# Patient Record
Sex: Female | Born: 1970 | Race: White | Hispanic: No | Marital: Single | Smoking: Former smoker
Health system: Southern US, Community
[De-identification: ages and names within clinical notes are randomized; demographics above are authoritative.]

## PROBLEM LIST (undated history)

## (undated) DIAGNOSIS — B019 Varicella without complication: Secondary | ICD-10-CM

## (undated) DIAGNOSIS — E079 Disorder of thyroid, unspecified: Secondary | ICD-10-CM

## (undated) DIAGNOSIS — J45909 Unspecified asthma, uncomplicated: Secondary | ICD-10-CM

## (undated) DIAGNOSIS — N200 Calculus of kidney: Secondary | ICD-10-CM

## (undated) DIAGNOSIS — N390 Urinary tract infection, site not specified: Secondary | ICD-10-CM

## (undated) DIAGNOSIS — G43909 Migraine, unspecified, not intractable, without status migrainosus: Secondary | ICD-10-CM

## (undated) DIAGNOSIS — I10 Essential (primary) hypertension: Secondary | ICD-10-CM

## (undated) DIAGNOSIS — K219 Gastro-esophageal reflux disease without esophagitis: Secondary | ICD-10-CM

## (undated) DIAGNOSIS — T7840XA Allergy, unspecified, initial encounter: Secondary | ICD-10-CM

## (undated) DIAGNOSIS — B029 Zoster without complications: Secondary | ICD-10-CM

## (undated) HISTORY — DX: Varicella without complication: B01.9

## (undated) HISTORY — DX: Unspecified asthma, uncomplicated: J45.909

## (undated) HISTORY — DX: Essential (primary) hypertension: I10

## (undated) HISTORY — PX: LITHOTRIPSY: SUR834

## (undated) HISTORY — PX: REDUCTION MAMMAPLASTY: SUR839

## (undated) HISTORY — DX: Urinary tract infection, site not specified: N39.0

## (undated) HISTORY — DX: Disorder of thyroid, unspecified: E07.9

## (undated) HISTORY — PX: ABDOMINAL HYSTERECTOMY: SHX81

## (undated) HISTORY — DX: Zoster without complications: B02.9

## (undated) HISTORY — DX: Migraine, unspecified, not intractable, without status migrainosus: G43.909

## (undated) HISTORY — PX: ANKLE RECONSTRUCTION: SHX1151

## (undated) HISTORY — DX: Gastro-esophageal reflux disease without esophagitis: K21.9

## (undated) HISTORY — DX: Allergy, unspecified, initial encounter: T78.40XA

## (undated) HISTORY — DX: Calculus of kidney: N20.0

---

## 1998-07-18 HISTORY — PX: BREAST SURGERY: SHX581

## 2014-06-16 ENCOUNTER — Ambulatory Visit (INDEPENDENT_AMBULATORY_CARE_PROVIDER_SITE_OTHER): Payer: Managed Care, Other (non HMO) | Admitting: Family

## 2014-06-16 ENCOUNTER — Encounter: Payer: Self-pay | Admitting: Family

## 2014-06-16 VITALS — BP 142/88 | HR 79 | Temp 98.7°F | Resp 18 | Ht 66.5 in | Wt 186.4 lb

## 2014-06-16 DIAGNOSIS — J45909 Unspecified asthma, uncomplicated: Secondary | ICD-10-CM | POA: Insufficient documentation

## 2014-06-16 DIAGNOSIS — K219 Gastro-esophageal reflux disease without esophagitis: Secondary | ICD-10-CM

## 2014-06-16 DIAGNOSIS — J4520 Mild intermittent asthma, uncomplicated: Secondary | ICD-10-CM

## 2014-06-16 DIAGNOSIS — Z124 Encounter for screening for malignant neoplasm of cervix: Secondary | ICD-10-CM

## 2014-06-16 NOTE — Progress Notes (Signed)
   Subjective:    Patient ID: Karen Hudson, female    DOB: 22-May-1971, 43 y.o.   MRN: [id number]  Chief Complaint  Patient presents with  . Establish Care    No issues just needs a PCP    HPI:  Karen Hudson is a 43 y.o. female who presents today to establish care and discuss her heartburn and asthma.   1) Heartburn - Currently stable, maintained on OTC medications as needed.  2) Asthma - Diagnosed when she was a child. Used to carry an inhaler, but has not used it in a while. Does not currently take any medications. Has not had an asthma attack in the last 10 years.   Allergies  Allergen Reactions  . Sulfa Antibiotics    No current outpatient prescriptions on file prior to visit.   No current facility-administered medications on file prior to visit.   Past Medical History  Diagnosis Date  . Asthma   . Allergy   . Chicken pox   . Shingles   . GERD (gastroesophageal reflux disease)   . Kidney stones   . Thyroid disease     Enlarged thyroid  . Urinary tract infection    Review of Systems    See HPI  Objective:    BP 142/88 mmHg  Pulse 79  Temp(Src) 98.7 F (37.1 C) (Oral)  Resp 18  Ht 5' 6.5" (1.689 m)  Wt 186 lb 6.4 oz (84.55 kg)  BMI 29.64 kg/m2  SpO2 97%  LMP 05/23/2014 Nursing note and vital signs reviewed.  Physical Exam  Constitutional: She is oriented to person, place, and time. She appears well-developed and well-nourished. No distress.  Cardiovascular: Normal rate, regular rhythm, normal heart sounds and intact distal pulses.   Pulmonary/Chest: Effort normal and breath sounds normal.  Neurological: She is alert and oriented to person, place, and time.  Skin: Skin is warm and dry.  Psychiatric: She has a normal mood and affect. Her behavior is normal. Judgment and thought content normal.       Assessment & Plan:

## 2014-06-16 NOTE — Progress Notes (Signed)
Pre visit review using our clinic review tool, if applicable. No additional management support is needed unless otherwise documented below in the visit note. 

## 2014-06-16 NOTE — Patient Instructions (Signed)
Thank you for choosing [facility].  Summary/Instructions:  Schedule a time for your physical.  A referral has been made to gynecology for your cervical cancer screening.

## 2014-06-16 NOTE — Assessment & Plan Note (Signed)
Stable. Continue without medications as needed. Should symptoms return, will restart albuterol.

## 2014-06-16 NOTE — Assessment & Plan Note (Signed)
Stable. Continue over the counter medications as needed. Consider prescription medication if symptoms worsen.

## 2014-06-27 ENCOUNTER — Telehealth: Payer: Self-pay | Admitting: Nurse Practitioner

## 2014-06-27 NOTE — Telephone Encounter (Signed)
lmtcb to schedule a new patient doctor referral for an AEX.

## 2014-07-01 NOTE — Telephone Encounter (Signed)
lmtcb to rs

## 2014-08-29 ENCOUNTER — Encounter: Payer: Self-pay | Admitting: Certified Nurse Midwife

## 2014-08-29 ENCOUNTER — Ambulatory Visit (INDEPENDENT_AMBULATORY_CARE_PROVIDER_SITE_OTHER): Payer: Managed Care, Other (non HMO) | Admitting: Certified Nurse Midwife

## 2014-08-29 VITALS — BP 112/74 | HR 68 | Resp 16 | Ht 66.25 in | Wt 185.0 lb

## 2014-08-29 DIAGNOSIS — Z01419 Encounter for gynecological examination (general) (routine) without abnormal findings: Secondary | ICD-10-CM

## 2014-08-29 DIAGNOSIS — Z23 Encounter for immunization: Secondary | ICD-10-CM

## 2014-08-29 DIAGNOSIS — B372 Candidiasis of skin and nail: Secondary | ICD-10-CM

## 2014-08-29 DIAGNOSIS — N852 Hypertrophy of uterus: Secondary | ICD-10-CM

## 2014-08-29 MED ORDER — NYSTATIN 100000 UNIT/GM EX POWD: Freq: Two times a day (BID) | CUTANEOUS | Status: DC

## 2014-08-29 NOTE — Patient Instructions (Signed)

## 2014-08-29 NOTE — Progress Notes (Signed)
Reviewed personally.  M. Suzanne Marston Mccadden, MD.  

## 2014-08-29 NOTE — Progress Notes (Signed)
44 y.o. [id number] Single  Caucasian Fe here to establish gyn care and  for annual exam. Periods normal, no issues. Contraception condoms No STD concerns or testing. Patient on weight watchers and has lost 40 pounds so far. Continues with program. History of enlarged thyroid with PCP management. Sees PCP aex, labs.. Patient has history of kidney stones with lithotripsy.  No health issues today, but requests look at area in groin ? Yeast.   Patient's last menstrual period was 07/31/2014.          Sexually active: Yes.    The current method of family planning is condoms most of the time.    Exercising: Yes.    walking Smoker:  no  Health Maintenance: Pap:  2012 neg per patient No abnormal paps per patient MMG: 2011? Colonoscopy:  none BMD:   none TDaP: due Labs: none Self breast exam: done occ   reports that she has quit smoking. She has never used smokeless tobacco. She reports that she drinks alcohol. She reports that she does not use illicit drugs.  Past Medical History  Diagnosis Date  . Asthma   . Allergy   . Chicken pox   . Shingles   . GERD (gastroesophageal reflux disease)   . Kidney stones   . Thyroid disease     Enlarged thyroid  . Urinary tract infection   . Migraines   . Asthma     as a child  . Hypertension     in the past, no longer on meds    Past Surgical History  Procedure Laterality Date  . Breast surgery  2000    breast reduction  . Ankle reconstruction    . Lithotripsy      No current outpatient prescriptions on file.   No current facility-administered medications for this visit.    Family History  Problem Relation Age of Onset  . Heart disease Father   . Squamous cell carcinoma Father   . Prostate cancer Maternal Grandfather   . Stroke Paternal Grandfather   . Hypertension Sister   . Hypertension Sister     ROS:  Pertinent items are noted in HPI.  Otherwise, a comprehensive ROS was negative.  Exam:   BP 112/74 mmHg  Pulse 68  Resp 16   Ht 5' 6.25" (1.683 m)  Wt 185 lb (83.915 kg)  BMI 29.63 kg/m2  LMP 07/31/2014 Height: 5' 6.25" (168.3 cm) Ht Readings from Last 3 Encounters:  08/29/14 5' 6.25" (1.683 m)  06/16/14 5' 6.5" (1.689 m)    General appearance: alert, cooperative and appears stated age Head: Normocephalic, without obvious abnormality, atraumatic Neck: no adenopathy, supple, symmetrical, trachea midline and thyroid enlarged Lungs: clear to auscultation bilaterally Breasts: normal appearance, no masses or tenderness, No nipple retraction or dimpling, No nipple discharge or bleeding, No axillary or supraclavicular adenopathy Heart: regular rate and rhythm Abdomen: soft, non-tender; no masses,  no organomegaly Extremities: extremities normal, atraumatic, no cyanosis or edema Skin: Skin color, texture, turgor normal. No rashes or lesions Lymph nodes: Cervical, supraclavicular, and axillary nodes normal. No abnormal inguinal nodes palpated Neurologic: Grossly normal   Pelvic: External genitalia:  no lesions, macular scaling area in groin with exudate, wet prep taken              Urethra:  normal appearing urethra with no masses, tenderness or lesions              Bartholin's and Skene's: normal  Vagina: normal appearing vagina with normal color and discharge, no lesions              Cervix: normal, non tender, no lesion              Pap taken: Yes.   Bimanual Exam:  Uterus:  enlarged, 14-16 week size, slightly firm tilts to left weeks size              Adnexa: normal adnexa and no mass, fullness, tenderness  Difficult to feel left adnexal as well, due to enlarged uterus               Rectovaginal: Confirms               Anus:  normal sphincter tone, no lesions  Wet prep: positive for yeast  Chaperone present: Yes  A:  Well Woman with normal exam  Contraception condoms  Enlarged uterus  Immunization update  Yeast dermatitis of groin area  Screening labs  P:   Reviewed health and wellness  pertinent to exam  Discussed enlarged uterus finding and possible etiology of uterine fibroids, adenomyosis, or mass. Discussed need PUS to evaluate, patient agreeable. Discussed with patient she will be called with insurance information and will be scheduled. Questions addressed.  Requests TDAP  Discussed yeast finding and need to dry area thoroughly before dressing to avoid reoccurrence.  Rx Nystatin powder see order  Pap smear taken today with HPVHR pap sent to [facility]  Rx given for screening labs CMP,Lipid panel, CBC, Vitamin D  counseled on breast self exam, mammography screening , given information on where to schedule, STD prevention, HIV risk factors and prevention, adequate intake of calcium and vitamin D, diet and exercise  return annually or prn  An After Visit Summary was printed and given to the patient.

## 2014-09-02 ENCOUNTER — Other Ambulatory Visit: Payer: Self-pay | Admitting: Certified Nurse Midwife

## 2014-09-02 NOTE — Addendum Note (Signed)
Addended by: Michele Mcalpine on: 09/02/2014 10:22 AM   Modules accepted: Orders

## 2014-09-03 LAB — CBC WITH DIFFERENTIAL/PLATELET
BASOS ABS: 0 10*3/uL (ref 0.0–0.2)
Basos: 1 %
EOS: 2 %
Eosinophils Absolute: 0.2 10*3/uL (ref 0.0–0.4)
HCT: 44 % (ref 34.0–46.6)
Hemoglobin: 14.6 g/dL (ref 11.1–15.9)
IMMATURE GRANS (ABS): 0 10*3/uL (ref 0.0–0.1)
Immature Granulocytes: 0 %
LYMPHS: 28 %
Lymphocytes Absolute: 1.8 10*3/uL (ref 0.7–3.1)
MCH: 29.3 pg (ref 26.6–33.0)
MCHC: 33.2 g/dL (ref 31.5–35.7)
MCV: 88 fL (ref 79–97)
MONOCYTES: 8 %
Monocytes Absolute: 0.5 10*3/uL (ref 0.1–0.9)
NEUTROS PCT: 61 %
Neutrophils Absolute: 4.1 10*3/uL (ref 1.4–7.0)
PLATELETS: 272 10*3/uL (ref 150–379)
RBC: 4.98 x10E6/uL (ref 3.77–5.28)
RDW: 13.9 % (ref 12.3–15.4)
WBC: 6.6 10*3/uL (ref 3.4–10.8)

## 2014-09-03 LAB — LIPID PANEL W/O CHOL/HDL RATIO
Cholesterol, Total: 196 mg/dL (ref 100–199)
HDL: 52 mg/dL (ref >39)
LDL Calculated: 131 mg/dL — ABNORMAL HIGH (ref 0–99)
Triglycerides: 64 mg/dL (ref 0–149)
VLDL Cholesterol Cal: 13 mg/dL (ref 5–40)

## 2014-09-03 LAB — COMPREHENSIVE METABOLIC PANEL
ALK PHOS: 48 IU/L (ref 39–117)
ALT: 6 IU/L (ref 0–32)
AST: 12 IU/L (ref 0–40)
Albumin/Globulin Ratio: 1.8 (ref 1.1–2.5)
Albumin: 4.2 g/dL (ref 3.5–5.5)
BILIRUBIN TOTAL: 0.4 mg/dL (ref 0.0–1.2)
BUN / CREAT RATIO: 18 (ref 9–23)
BUN: 13 mg/dL (ref 6–24)
CO2: 24 mmol/L (ref 18–29)
Calcium: 9.4 mg/dL (ref 8.7–10.2)
Chloride: 102 mmol/L (ref 97–108)
Creatinine, Ser: 0.71 mg/dL (ref 0.57–1.00)
GFR calc Af Amer: 119 mL/min/{1.73_m2} (ref >59)
GFR, EST NON AFRICAN AMERICAN: 103 mL/min/{1.73_m2} (ref >59)
Globulin, Total: 2.4 g/dL (ref 1.5–4.5)
Glucose: 87 mg/dL (ref 65–99)
Potassium: 4.6 mmol/L (ref 3.5–5.2)
SODIUM: 141 mmol/L (ref 134–144)
TOTAL PROTEIN: 6.6 g/dL (ref 6.0–8.5)

## 2014-09-03 LAB — VITAMIN D 25 HYDROXY (VIT D DEFICIENCY, FRACTURES): Vit D, 25-Hydroxy: 27.8 ng/mL — ABNORMAL LOW (ref 30.0–100.0)

## 2014-09-23 ENCOUNTER — Ambulatory Visit (INDEPENDENT_AMBULATORY_CARE_PROVIDER_SITE_OTHER): Payer: Managed Care, Other (non HMO) | Admitting: Obstetrics & Gynecology

## 2014-09-23 ENCOUNTER — Other Ambulatory Visit: Payer: Self-pay | Admitting: Obstetrics & Gynecology

## 2014-09-23 ENCOUNTER — Ambulatory Visit (INDEPENDENT_AMBULATORY_CARE_PROVIDER_SITE_OTHER): Payer: Managed Care, Other (non HMO)

## 2014-09-23 VITALS — BP 126/84 | Wt 183.6 lb

## 2014-09-23 DIAGNOSIS — D250 Submucous leiomyoma of uterus: Secondary | ICD-10-CM

## 2014-09-23 DIAGNOSIS — N9489 Other specified conditions associated with female genital organs and menstrual cycle: Secondary | ICD-10-CM

## 2014-09-23 DIAGNOSIS — D251 Intramural leiomyoma of uterus: Secondary | ICD-10-CM

## 2014-09-23 DIAGNOSIS — R102 Pelvic and perineal pain: Secondary | ICD-10-CM

## 2014-09-23 DIAGNOSIS — N852 Hypertrophy of uterus: Secondary | ICD-10-CM | POA: Diagnosis not present

## 2014-09-23 MED ORDER — NORETHINDRONE 0.35 MG PO TABS: 1.0000 | ORAL_TABLET | Freq: Every day | ORAL | Status: DC

## 2014-09-23 NOTE — Progress Notes (Signed)
44 y.o. G1P1 Singlefemale here for a pelvic ultrasound due to enlarged uterus on physical exam consistent with fibroids.  Pt has never been told this in the past and was surprised by Debbi Leonard's findings on physical exam.  However, once she was aware of this, she realizes now other symptoms she is having are probably linked.  Although she reported her cycles as "normal", they are heavy and have been heavy for years.  This is just her normal.  Also, she has a lot of pelvic and bladder pressure, especially when she gets close to her menstrual cycle.  Pt is relieved she actually knows the source of some of this pain/discomfort.  Patient's last menstrual period was 09/06/2014.  Sexually active:  yes  Contraception: condoms  FINDINGS: UTERUS: 13 x 8 x 6cm with multiple fibroids.  Largest is 4 x 3cm, then several 2cm fibroids and several 1.5cm fibroids.  There appear to be two small submucosal fibroids that distort the endometrial cavity as well. EMS: 46mm ADNEXA:   Left ovary 3.7 2.6 x 2.0cm with resolving corpus luteal cyst   Right ovary 3.2 x 1.7 x 1.6cm CUL DE SAC: no free fluid  Findings and images reviewed with pt.  treatment options including OCPs, progestin methods, hysteroscopic fibroid resection, myomectomy, uterine artery embolization, and hysterectomy discussed.  Information on all of this given to pt to review.  She is pretty sure she wants to go ahead and proceed with hysterectomy.  Will consider options a little further but doubts she will change her mind.  Different types of hysterectomy discussed.  There is the possibility this can be done laparoscopically, depending on mobility and size at time of pre operative exam.  Pt not sure about timing so this may influence type of procedure.  Voices understanding.    Assessment:  Enlarged uterus with multiple fibroids, largest off to right and 4 x 3 cm, two small submucosal fibroids  Plan: Pt contemplating options but at this time seems to be  leaning towards hysterectomy.  She will call about proceeding. On [place].  She will continue this for now.  ~25 minutes spent with patient >50% of time was in face to face discussion of above.

## 2014-09-30 ENCOUNTER — Telehealth: Payer: Self-pay | Admitting: Obstetrics & Gynecology

## 2014-09-30 NOTE — Telephone Encounter (Signed)
Patient was seen 09/23/14 and was told the surgery scheduler would contact her soon. I informed patient Gay Filler is out of the office today. Patient is aware will not receive a call toady.

## 2014-10-01 NOTE — Telephone Encounter (Signed)
Return call to patient. States she is anxious to proceed with scheduling hysterectomy. She would like to plan for May 23 or later due to work schedule. Scheduling policies discussed. Advised business office will complete pre-certification. Will review surgery schedule availability with Dr Sabra Heck and notify her.   How would you like this case posted?

## 2014-10-02 ENCOUNTER — Telehealth: Payer: Self-pay | Admitting: Obstetrics & Gynecology

## 2014-10-02 NOTE — Telephone Encounter (Signed)
Patient returned call. She has some scheduling and financial arrangements to make.  May decide to wait until a little further into summer.  She is not having any problems with fibroids. She will call back when she is ready to proceed.   Routing to provider for final review. Patient agreeable to disposition. Will close encounter

## 2014-10-02 NOTE — Telephone Encounter (Signed)
Patient calling with additional questions related to surgery.

## 2014-10-02 NOTE — Telephone Encounter (Signed)
Returning call.

## 2014-10-02 NOTE — Telephone Encounter (Signed)
Left message for patient to call back. Need to go over surgery benefits. °

## 2014-10-02 NOTE — Telephone Encounter (Signed)
Robotic TLH/bilateral salpingectomy, possible BSO.

## 2014-10-02 NOTE — Telephone Encounter (Signed)
Spoke to patient regarding date options. Advised Dec 08, 2014 is available. Patient considering options and will let me know when ready to proceed.

## 2014-10-02 NOTE — Telephone Encounter (Signed)
Call to patient, left message to call back. Calling to provide additional information.

## 2014-10-02 NOTE — Telephone Encounter (Signed)
Patient calls back and desires to proceed with surgery on 12-08-14 as previously discussed.  Will send case request to central scheduling and notify patient once confirmed. Will need to schedule surgery consult in early May. Patient agreeable.

## 2014-10-02 NOTE — Telephone Encounter (Signed)
Spoke with patient. Advised of benefit quote received for surgery.  Patient transferred to Gay Filler to discuss further.

## 2014-10-05 ENCOUNTER — Encounter: Payer: Self-pay | Admitting: Obstetrics & Gynecology

## 2014-10-05 DIAGNOSIS — D250 Submucous leiomyoma of uterus: Secondary | ICD-10-CM | POA: Insufficient documentation

## 2014-10-05 DIAGNOSIS — D251 Intramural leiomyoma of uterus: Secondary | ICD-10-CM | POA: Insufficient documentation

## 2014-10-05 DIAGNOSIS — R102 Pelvic and perineal pain: Secondary | ICD-10-CM | POA: Insufficient documentation

## 2014-10-05 DIAGNOSIS — N852 Hypertrophy of uterus: Secondary | ICD-10-CM | POA: Insufficient documentation

## 2014-10-07 NOTE — Telephone Encounter (Signed)
Surgery scheduled for 12-08-14 at 0730 at [facility] as previously discussed.  Call to patient to review surgery instructions. Left message to call back.

## 2014-10-07 NOTE — Telephone Encounter (Signed)
Patient returned call. Advised surgery scheduled as requested. Surgery instruction sheet reviewed with patient and printed copy will be mailed. (see scanned copy).     Routing to provider for final review. Patient agreeable to disposition. Will close encounter

## 2014-10-07 NOTE — Telephone Encounter (Signed)
Call back to patient, left message to call back.

## 2014-10-07 NOTE — Telephone Encounter (Signed)
Returning a call to Sally. °

## 2014-11-17 ENCOUNTER — Telehealth: Payer: Self-pay | Admitting: Obstetrics & Gynecology

## 2014-11-17 NOTE — Telephone Encounter (Signed)
Return call to patient. Question was actually for business office. Call transferred.   Routing to provider for final review. Patient agreeable to disposition. Will close encounter

## 2014-11-17 NOTE — Telephone Encounter (Signed)
Pt has questions regarding her upcoming surgery

## 2014-11-21 ENCOUNTER — Encounter (HOSPITAL_COMMUNITY): Payer: Self-pay

## 2014-11-21 ENCOUNTER — Other Ambulatory Visit: Payer: Self-pay

## 2014-11-21 ENCOUNTER — Encounter (HOSPITAL_COMMUNITY)
Admission: RE | Admit: 2014-11-21 | Discharge: 2014-11-21 | Disposition: A | Discharge status: Home / Self Care | Payer: Managed Care, Other (non HMO) | Source: Ambulatory Visit | Attending: Obstetrics & Gynecology | Admitting: Obstetrics & Gynecology

## 2014-11-21 DIAGNOSIS — Z01818 Encounter for other preprocedural examination: Secondary | ICD-10-CM | POA: Insufficient documentation

## 2014-11-21 LAB — CBC
HCT: 41.4 % (ref 36.0–46.0)
Hemoglobin: 14.2 g/dL (ref 12.0–15.0)
MCH: 29.9 pg (ref 26.0–34.0)
MCHC: 34.3 g/dL (ref 30.0–36.0)
MCV: 87.2 fL (ref 78.0–100.0)
PLATELETS: 259 10*3/uL (ref 150–400)
RBC: 4.75 MIL/uL (ref 3.87–5.11)
RDW: 13.2 % (ref 11.5–15.5)
WBC: 7.1 10*3/uL (ref 4.0–10.5)

## 2014-11-21 NOTE — Pre-Procedure Instructions (Signed)
Patient's blood pressure was elevated at PAT visit. Dr. Glennon Mac made aware and an EKG was ordered and done. Dr. Glennon Mac reviewed EKG and spoke with pt. No new orders received.

## 2014-11-21 NOTE — Patient Instructions (Addendum)
Your procedure is scheduled on: Dec 08, 2014  Enter through the Main Entrance of [facility] at:  0600 am  Pick up the phone at the desk and dial [phone].  Call this number if you have problems the morning of surgery: [phone].  Remember: Do NOT eat food: after midnight on Sunday  Do NOT drink clear liquids after: after midnight on Sunday  Take these medicines the morning of surgery with a SIP OF WATER: none  Do NOT wear jewelry (body piercing), metal hair clips/bobby pins, make-up, or nail polish. Do NOT wear lotions, powders, or perfumes.  You may wear deoderant. Do NOT shave for 48 hours prior to surgery. Do NOT bring valuables to the hospital. Contacts, dentures, or bridgework may not be worn into surgery. Leave suitcase in car.  After surgery it may be brought to your room.  For patients admitted to the hospital, checkout time is 11:00 AM the day of discharge.

## 2014-11-24 ENCOUNTER — Encounter: Payer: Self-pay | Admitting: Family

## 2014-11-24 ENCOUNTER — Ambulatory Visit (INDEPENDENT_AMBULATORY_CARE_PROVIDER_SITE_OTHER): Payer: Managed Care, Other (non HMO) | Admitting: Family

## 2014-11-24 VITALS — BP 136/100 | HR 69 | Temp 98.2°F | Resp 18 | Ht 66.25 in | Wt 176.0 lb

## 2014-11-24 DIAGNOSIS — I10 Essential (primary) hypertension: Secondary | ICD-10-CM | POA: Diagnosis not present

## 2014-11-24 MED ORDER — HYDROCHLOROTHIAZIDE 12.5 MG PO CAPS: 12.5000 mg | ORAL_CAPSULE | Freq: Every day | ORAL | Status: DC

## 2014-11-24 NOTE — Patient Instructions (Signed)
Thank you for choosing [facility].  Summary/Instructions:  Your prescription(s) have been submitted to your pharmacy or been printed and provided for you. Please take as directed and contact our office if you believe you are having problem(s) with the medication(s) or have any questions.  If your symptoms worsen or fail to improve, please contact our office for further instruction, or in case of emergency go directly to the emergency room at the closest medical facility.   Hypertension Hypertension, commonly called high blood pressure, is when the force of blood pumping through your arteries is too strong. Your arteries are the blood vessels that carry blood from your heart throughout your body. A blood pressure reading consists of a higher number over a lower number, such as 110/72. The higher number (systolic) is the pressure inside your arteries when your heart pumps. The lower number (diastolic) is the pressure inside your arteries when your heart relaxes. Ideally you want your blood pressure below 120/80. Hypertension forces your heart to work harder to pump blood. Your arteries may become narrow or stiff. Having hypertension puts you at risk for heart disease, stroke, and other problems.  RISK FACTORS Some risk factors for high blood pressure are controllable. Others are not.  Risk factors you cannot control include:   Race. You may be at higher risk if you are African American.  Age. Risk increases with age.  Gender. Men are at higher risk than women before age 82 years. After age 46, women are at higher risk than men. Risk factors you can control include:  Not getting enough exercise or physical activity.  Being overweight.  Getting too much fat, sugar, calories, or salt in your diet.  Drinking too much alcohol. SIGNS AND SYMPTOMS Hypertension does not usually cause signs or symptoms. Extremely high blood pressure (hypertensive crisis) may cause headache, anxiety,  shortness of breath, and nosebleed. DIAGNOSIS  To check if you have hypertension, your health care provider will measure your blood pressure while you are seated, with your arm held at the level of your heart. It should be measured at least twice using the same arm. Certain conditions can cause a difference in blood pressure between your right and left arms. A blood pressure reading that is higher than normal on one occasion does not mean that you need treatment. If one blood pressure reading is high, ask your health care provider about having it checked again. TREATMENT  Treating high blood pressure includes making lifestyle changes and possibly taking medicine. Living a healthy lifestyle can help lower high blood pressure. You may need to change some of your habits. Lifestyle changes may include:  Following the DASH diet. This diet is high in fruits, vegetables, and whole grains. It is low in salt, red meat, and added sugars.  Getting at least 2 hours of brisk physical activity every week.  Losing weight if necessary.  Not smoking.  Limiting alcoholic beverages.  Learning ways to reduce stress. If lifestyle changes are not enough to get your blood pressure under control, your health care provider may prescribe medicine. You may need to take more than one. Work closely with your health care provider to understand the risks and benefits. HOME CARE INSTRUCTIONS  Have your blood pressure rechecked as directed by your health care provider.   Take medicines only as directed by your health care provider. Follow the directions carefully. Blood pressure medicines must be taken as prescribed. The medicine does not work as well when you skip doses.  Skipping doses also puts you at risk for problems.   Do not smoke.   Monitor your blood pressure at home as directed by your health care provider. SEEK MEDICAL CARE IF:   You think you are having a reaction to medicines taken.  You have  recurrent headaches or feel dizzy.  You have swelling in your ankles.  You have trouble with your vision. SEEK IMMEDIATE MEDICAL CARE IF:  You develop a severe headache or confusion.  You have unusual weakness, numbness, or feel faint.  You have severe chest or abdominal pain.  You vomit repeatedly.  You have trouble breathing. MAKE SURE YOU:   Understand these instructions.  Will watch your condition.  Will get help right away if you are not doing well or get worse. Document Released: 07/04/2005 Document Revised: 11/18/2013 Document Reviewed: 04/26/2013 [facility] Patient Information 2015 [place], [place]. This information is not intended to replace advice given to you by your health care provider. Make sure you discuss any questions you have with your health care provider.

## 2014-11-24 NOTE — Progress Notes (Signed)
Pre visit review using our clinic review tool, if applicable. No additional management support is needed unless otherwise documented below in the visit note. 

## 2014-11-24 NOTE — Assessment & Plan Note (Signed)
Blood pressure above goal of 140/90 on multiple occasions. Start hydrochlorothiazide. Follow up in one week for blood pressure check. If blood pressure remains uncontrolled, increase hydrochlorothiazide to 25 mg.

## 2014-11-24 NOTE — Progress Notes (Signed)
   Subjective:    Patient ID: Karen Hudson, female    DOB: May 29, 1971, 44 y.o.   MRN: [id number]  Chief Complaint  Patient presents with  . Hypertension    136/106 having a hysterectomy done at the end of the month and was told to have it checked or she couldn't have surgery    HPI:  Karen Hudson is a 44 y.o. female with a PMH of intrinsic asthma, GERD, and leiomyoma of uterus who presents today for an office visit.   Scheduled to have a hysterectomy at the end of the month, however she was noted to have a blood pressure of 134/106 during her preoperative visit. She was instructed to follow up in primary care in efforts to reduce her blood pressure. She has been experiencing associated symptom of increased blood pressure for approximately a couple of months. Notes that she was previously on medication for blood pressure however was able to controlled with the modifying factors of diet and exercise.    Allergies  Allergen Reactions  . Sulfa Antibiotics Shortness Of Breath and Swelling    Current Outpatient Prescriptions on File Prior to Visit  Medication Sig Dispense Refill  . Multiple Vitamin (MULTIVITAMIN WITH MINERALS) TABS tablet Take 1 tablet by mouth daily.    . norethindrone (MICRONOR,CAMILA,ERRIN) 0.35 MG tablet Take 1 tablet (0.35 mg total) by mouth daily. 1 Package 5  . nystatin (MYCOSTATIN) powder Apply topically 2 (two) times daily. Apply to affected area for up to 7 days dust lightly 15 g 1   No current facility-administered medications on file prior to visit.    Review of Systems  Eyes:       Negative for changes in vision.   Respiratory: Negative for chest tightness and shortness of breath.   Cardiovascular: Negative for chest pain, palpitations and leg swelling.  Neurological: Positive for headaches (Occasional).      Objective:    BP 136/100 mmHg  Pulse 69  Temp(Src) 98.2 F (36.8 C) (Oral)  Resp 18  Ht 5' 6.25" (1.683 m)  Wt 176 lb (79.833 kg)  BMI  28.18 kg/m2  SpO2 98%  LMP 11/17/2014 (Exact Date) Nursing note and vital signs reviewed.  Physical Exam  Constitutional: She is oriented to person, place, and time. She appears well-developed and well-nourished. No distress.  Cardiovascular: Normal rate, regular rhythm, normal heart sounds and intact distal pulses.   Mild nonpitting edema bilateral lower extremities  Pulmonary/Chest: Effort normal and breath sounds normal.  Neurological: She is alert and oriented to person, place, and time.  Skin: Skin is warm and dry.  Psychiatric: She has a normal mood and affect. Her behavior is normal. Judgment and thought content normal.       Assessment & Plan:

## 2014-11-28 ENCOUNTER — Encounter: Payer: Self-pay | Admitting: Obstetrics & Gynecology

## 2014-11-28 ENCOUNTER — Ambulatory Visit (INDEPENDENT_AMBULATORY_CARE_PROVIDER_SITE_OTHER): Payer: Managed Care, Other (non HMO) | Admitting: Obstetrics & Gynecology

## 2014-11-28 ENCOUNTER — Telehealth: Payer: Self-pay | Admitting: Family

## 2014-11-28 ENCOUNTER — Telehealth: Payer: Self-pay | Admitting: *Deleted

## 2014-11-28 VITALS — BP 128/94 | HR 76 | Resp 20 | Ht 66.25 in | Wt 184.0 lb

## 2014-11-28 DIAGNOSIS — N9489 Other specified conditions associated with female genital organs and menstrual cycle: Secondary | ICD-10-CM | POA: Diagnosis not present

## 2014-11-28 DIAGNOSIS — N920 Excessive and frequent menstruation with regular cycle: Secondary | ICD-10-CM | POA: Diagnosis not present

## 2014-11-28 DIAGNOSIS — D250 Submucous leiomyoma of uterus: Secondary | ICD-10-CM | POA: Diagnosis not present

## 2014-11-28 DIAGNOSIS — D251 Intramural leiomyoma of uterus: Secondary | ICD-10-CM

## 2014-11-28 DIAGNOSIS — R102 Pelvic and perineal pain: Secondary | ICD-10-CM

## 2014-11-28 DIAGNOSIS — N852 Hypertrophy of uterus: Secondary | ICD-10-CM | POA: Diagnosis not present

## 2014-11-28 MED ORDER — IBUPROFEN 800 MG PO TABS: 800.0000 mg | ORAL_TABLET | Freq: Three times a day (TID) | ORAL | Status: DC | PRN

## 2014-11-28 MED ORDER — OXYCODONE-ACETAMINOPHEN 5-325 MG PO TABS: 2.0000 | ORAL_TABLET | ORAL | Status: DC | PRN

## 2014-11-28 NOTE — Telephone Encounter (Signed)
If she is not having any side effects, please have her increase to 25 mg of HCTZ daily.

## 2014-11-28 NOTE — Telephone Encounter (Signed)
Please advise 

## 2014-11-28 NOTE — Telephone Encounter (Signed)
Patient states she had her BP took at [facility] - reading was 128/94

## 2014-11-28 NOTE — Progress Notes (Signed)
44 y.o. [id number] Single Caucasian female here for discussion of upcoming procedure Robotic TLH/ planned due to enlarged uterus, uterine fibroids, pelvic pressure, menorrhagia.  Pt has been evaluated with PUS showing 13 x 8 x 6cm uterus with multiples fibroids, largest 4 x 3cm.  She has been counseled about alternative options but has decided to proceed with definitive management.    Pt was seen for pre-op visit at hospital 12/23/10 and diastolic BP was elevated to 106.  Was referred to PCP who started her on 12.5mg  HCTZ.  Diastolic is 94 today.  She is to call him for possible change in medication.  I will send a message as well.   Procedure discussed with patient.  Hospital stay, recovery and pain management all discussed.  Risks discussed including but not limited to bleeding, 1% risk of receiving a  transfusion, infection, 3-4% risk of bowel/bladder/ureteral/vascular injury discussed as well as possible need for additional surgery if injury does occur discussed.  DVT/PE and rare risk of death discussed.  My actual complications with prior surgeries discussed.  Vaginal cuff dehiscence discussed.  Hernia formation discussed.  Positioning and incision locations discussed.  Patient aware if pathology abnormal she may need additional treatment.  All questions answered.    Ob Hx:   Patient's last menstrual period was 11/17/2014 (exact date).          Sexually active: Yes.   Birth control: oral progesterone-only contraceptive Last pap: 08/29/14 with Manon Hilding Hollice Espy Last MMG: last one 2011.  Encouraged to proceed with one now, if possible Tobacco: none, former smoker  Past Surgical History  Procedure Laterality Date  . Breast surgery  2000    breast reduction  . Ankle reconstruction    . Lithotripsy      Past Medical History  Diagnosis Date  . Asthma   . Allergy   . Chicken pox   . Shingles   . GERD (gastroesophageal reflux disease)   . Kidney stones   . Thyroid disease     Enlarged thyroid  .  Urinary tract infection   . Migraines   . Asthma     as a child  . Hypertension     in the past, no longer on meds    Allergies: Sulfa antibiotics  Current Outpatient Prescriptions  Medication Sig Dispense Refill  . hydrochlorothiazide (MICROZIDE) 12.5 MG capsule Take 1 capsule (12.5 mg total) by mouth daily. 30 capsule 0  . Multiple Vitamin (MULTIVITAMIN WITH MINERALS) TABS tablet Take 1 tablet by mouth daily.    . norethindrone (MICRONOR,CAMILA,ERRIN) 0.35 MG tablet Take 1 tablet (0.35 mg total) by mouth daily. 1 Package 5  . nystatin (MYCOSTATIN) powder Apply topically 2 (two) times daily. Apply to affected area for up to 7 days dust lightly (Patient not taking: Reported on 11/28/2014) 15 g 1   No current facility-administered medications for this visit.    ROS: A comprehensive review of systems was negative.  Exam:    BP 128/94 mmHg  Pulse 76  Resp 20  Ht 5' 6.25" (1.683 m)  Wt 184 lb (83.462 kg)  BMI 29.47 kg/m2  LMP 11/17/2014 (Exact Date)  General appearance: alert and cooperative Head: Normocephalic, without obvious abnormality, atraumatic Neck: no adenopathy, supple, symmetrical, trachea midline and thyroid not enlarged, symmetric, no tenderness/mass/nodules Lungs: clear to auscultation bilaterally Heart: regular rate and rhythm, S1, S2 normal, no murmur, click, rub or gallop Abdomen: soft, non-tender; bowel sounds normal; no masses,  no organomegaly Extremities: extremities normal, atraumatic, no  cyanosis or edema Skin: Skin color, texture, turgor normal. No rashes or lesions Lymph nodes: Cervical, supraclavicular, and axillary nodes normal. no inguinal nodes palpated Neurologic: Grossly normal  Pelvic: External genitalia:  no lesions except for skin tag on right vulva              Urethra: normal appearing urethra with no masses, tenderness or lesions              Bartholins and Skenes: normal                 Vagina: normal appearing vagina with normal color  and discharge, no lesions              Cervix: normal appearance              Pap taken: No.        Bimanual Exam:  Uterus:  enlarged to 12 week's size, mobile, goblular                                      Adnexa:    normal adnexa in size, nontender and no masses                                      Rectovaginal: Deferred                                      Anus:  defer exam  A: Symptomatic uterine fibroids desirous of definitive management Hypertension  P: Robotic TLH/bilateral salpingectomy/cystosocpy/possible BSO planned.  Will need to add to consent form removal of vulvar right sided skin tag. Rx for Motrin and Percocet given. Medications/Vitamins reviewed.  Pt knows needs to stop any ASA products. Hysterectomy brochure given for pre and post op instructions.  ~25 minutes spent with patient >50% of time was in face to face discussion of above.

## 2014-11-28 NOTE — Telephone Encounter (Addendum)
Left voicemail for pt to mobile # - per DPR. re: Pap results normal and no need to send [place] a copy.

## 2014-11-28 NOTE — Telephone Encounter (Signed)
Pt aware of response below.  

## 2014-12-01 ENCOUNTER — Telehealth: Payer: Self-pay

## 2014-12-01 NOTE — Telephone Encounter (Signed)
Neg pap from [facility] received. Neg 02 pap. aex scheduled for 2/17

## 2014-12-05 ENCOUNTER — Encounter: Payer: Self-pay | Admitting: Obstetrics & Gynecology

## 2014-12-07 MED ORDER — DEXTROSE 5 % IV SOLN
2.0000 g | INTRAVENOUS | Status: DC
  Filled 2014-12-07: qty 2

## 2014-12-07 NOTE — Anesthesia Preprocedure Evaluation (Signed)
Anesthesia Evaluation  Patient identified by MRN, date of birth, ID band Patient awake    Reviewed: Allergy & Precautions, NPO status , Patient's Chart, lab work & pertinent test results  History of Anesthesia Complications Negative for: history of anesthetic complications  Airway Mallampati: II  TM Distance: >3 FB Neck ROM: Full    Dental no notable dental hx. (+) Dental Advisory Given   Pulmonary asthma , former smoker,  breath sounds clear to auscultation  Pulmonary exam normal       Cardiovascular hypertension (hx of HTN, no longer on medications), Normal cardiovascular examRhythm:Regular Rate:Normal     Neuro/Psych  Headaches, negative psych ROS   GI/Hepatic negative GI ROS, Neg liver ROS, GERD-  Medicated and Controlled,  Endo/Other  negative endocrine ROS  Renal/GU negative Renal ROS  negative genitourinary   Musculoskeletal negative musculoskeletal ROS (+)   Abdominal   Peds negative pediatric ROS (+)  Hematology negative hematology ROS (+)   Anesthesia Other Findings   Reproductive/Obstetrics negative OB ROS                             Anesthesia Physical Anesthesia Plan  ASA: II  Anesthesia Plan: General   Post-op Pain Management:    Induction: Intravenous  Airway Management Planned: Oral ETT  Additional Equipment:   Intra-op Plan:   Post-operative Plan: Extubation in OR  Informed Consent: I have reviewed the patients History and Physical, chart, labs and discussed the procedure including the risks, benefits and alternatives for the proposed anesthesia with the patient or authorized representative who has indicated his/her understanding and acceptance.   Dental advisory given  Plan Discussed with: CRNA  Anesthesia Plan Comments:         Anesthesia Quick Evaluation

## 2014-12-08 ENCOUNTER — Ambulatory Visit (HOSPITAL_COMMUNITY): Payer: Managed Care, Other (non HMO) | Admitting: Anesthesiology

## 2014-12-08 ENCOUNTER — Encounter (HOSPITAL_COMMUNITY)
Admission: RE | Disposition: A | Discharge status: Home / Self Care | Payer: Self-pay | Source: Ambulatory Visit | Attending: Obstetrics & Gynecology

## 2014-12-08 ENCOUNTER — Ambulatory Visit (HOSPITAL_COMMUNITY)
Admission: RE | Admit: 2014-12-08 | Discharge: 2014-12-09 | Disposition: A | Discharge status: Home / Self Care | Payer: Managed Care, Other (non HMO) | Source: Ambulatory Visit | Attending: Obstetrics & Gynecology | Admitting: Obstetrics & Gynecology

## 2014-12-08 ENCOUNTER — Encounter (HOSPITAL_COMMUNITY): Payer: Self-pay | Admitting: Anesthesiology

## 2014-12-08 DIAGNOSIS — Z87442 Personal history of urinary calculi: Secondary | ICD-10-CM | POA: Insufficient documentation

## 2014-12-08 DIAGNOSIS — Z87891 Personal history of nicotine dependence: Secondary | ICD-10-CM | POA: Insufficient documentation

## 2014-12-08 DIAGNOSIS — N840 Polyp of corpus uteri: Secondary | ICD-10-CM | POA: Diagnosis not present

## 2014-12-08 DIAGNOSIS — D259 Leiomyoma of uterus, unspecified: Secondary | ICD-10-CM | POA: Diagnosis present

## 2014-12-08 DIAGNOSIS — N888 Other specified noninflammatory disorders of cervix uteri: Secondary | ICD-10-CM | POA: Diagnosis not present

## 2014-12-08 DIAGNOSIS — N838 Other noninflammatory disorders of ovary, fallopian tube and broad ligament: Secondary | ICD-10-CM | POA: Diagnosis not present

## 2014-12-08 DIAGNOSIS — D280 Benign neoplasm of vulva: Secondary | ICD-10-CM | POA: Insufficient documentation

## 2014-12-08 DIAGNOSIS — F1099 Alcohol use, unspecified with unspecified alcohol-induced disorder: Secondary | ICD-10-CM | POA: Insufficient documentation

## 2014-12-08 DIAGNOSIS — Z87440 Personal history of urinary (tract) infections: Secondary | ICD-10-CM | POA: Insufficient documentation

## 2014-12-08 DIAGNOSIS — I10 Essential (primary) hypertension: Secondary | ICD-10-CM | POA: Insufficient documentation

## 2014-12-08 DIAGNOSIS — G43909 Migraine, unspecified, not intractable, without status migrainosus: Secondary | ICD-10-CM | POA: Insufficient documentation

## 2014-12-08 DIAGNOSIS — K219 Gastro-esophageal reflux disease without esophagitis: Secondary | ICD-10-CM | POA: Insufficient documentation

## 2014-12-08 DIAGNOSIS — Z882 Allergy status to sulfonamides status: Secondary | ICD-10-CM | POA: Diagnosis not present

## 2014-12-08 DIAGNOSIS — D251 Intramural leiomyoma of uterus: Secondary | ICD-10-CM | POA: Diagnosis not present

## 2014-12-08 DIAGNOSIS — E049 Nontoxic goiter, unspecified: Secondary | ICD-10-CM | POA: Diagnosis not present

## 2014-12-08 DIAGNOSIS — J45909 Unspecified asthma, uncomplicated: Secondary | ICD-10-CM | POA: Insufficient documentation

## 2014-12-08 DIAGNOSIS — N920 Excessive and frequent menstruation with regular cycle: Secondary | ICD-10-CM

## 2014-12-08 DIAGNOSIS — N9489 Other specified conditions associated with female genital organs and menstrual cycle: Secondary | ICD-10-CM | POA: Diagnosis not present

## 2014-12-08 HISTORY — PX: BILATERAL SALPINGECTOMY: SHX5743

## 2014-12-08 HISTORY — PX: CYSTOSCOPY: SHX5120

## 2014-12-08 HISTORY — PX: ROBOTIC ASSISTED TOTAL HYSTERECTOMY: SHX6085

## 2014-12-08 HISTORY — PX: VULVAR LESION REMOVAL: SHX5391

## 2014-12-08 LAB — BASIC METABOLIC PANEL
Anion gap: 5 (ref 5–15)
BUN: 14 mg/dL (ref 6–20)
CO2: 28 mmol/L (ref 22–32)
Calcium: 9.1 mg/dL (ref 8.9–10.3)
Chloride: 105 mmol/L (ref 101–111)
Creatinine, Ser: 0.78 mg/dL (ref 0.44–1.00)
GFR calc Af Amer: >60 mL/min (ref >60)
GLUCOSE: 93 mg/dL (ref 65–99)
POTASSIUM: 3.4 mmol/L — AB (ref 3.5–5.1)
Sodium: 138 mmol/L (ref 135–145)

## 2014-12-08 LAB — PREGNANCY, URINE: Preg Test, Ur: NEGATIVE

## 2014-12-08 SURGERY — ROBOTIC ASSISTED TOTAL HYSTERECTOMY
Anesthesia: General | Site: Vagina

## 2014-12-08 MED ORDER — ALUM & MAG HYDROXIDE-SIMETH 200-200-20 MG/5ML PO SUSP: 30.0000 mL | ORAL | Status: DC | PRN

## 2014-12-08 MED ORDER — PANTOPRAZOLE SODIUM 40 MG IV SOLR
40.0000 mg | Freq: Every day | INTRAVENOUS | Status: DC
  Administered 2014-12-08: 40 mg via INTRAVENOUS
  Filled 2014-12-08: qty 40

## 2014-12-08 MED ORDER — SODIUM CHLORIDE 0.9 % IJ SOLN
INTRAMUSCULAR | Status: AC
  Filled 2014-12-08: qty 50

## 2014-12-08 MED ORDER — SIMETHICONE 80 MG PO CHEW: 80.0000 mg | CHEWABLE_TABLET | Freq: Four times a day (QID) | ORAL | Status: DC | PRN

## 2014-12-08 MED ORDER — MENTHOL 3 MG MT LOZG: 1.0000 | LOZENGE | OROMUCOSAL | Status: DC | PRN

## 2014-12-08 MED ORDER — ROPIVACAINE HCL 5 MG/ML IJ SOLN
INTRAMUSCULAR | Status: AC
  Filled 2014-12-08: qty 30

## 2014-12-08 MED ORDER — NEOSTIGMINE METHYLSULFATE 10 MG/10ML IV SOLN
INTRAVENOUS | Status: AC
  Filled 2014-12-08: qty 1

## 2014-12-08 MED ORDER — ACETAMINOPHEN 325 MG PO TABS: 650.0000 mg | ORAL_TABLET | ORAL | Status: DC | PRN

## 2014-12-08 MED ORDER — FENTANYL CITRATE (PF) 250 MCG/5ML IJ SOLN
INTRAMUSCULAR | Status: AC
  Filled 2014-12-08: qty 5

## 2014-12-08 MED ORDER — FENTANYL CITRATE (PF) 100 MCG/2ML IJ SOLN
INTRAMUSCULAR | Status: DC | PRN
  Administered 2014-12-08: 100 ug via INTRAVENOUS
  Administered 2014-12-08 (×3): 50 ug via INTRAVENOUS
  Administered 2014-12-08: 100 ug via INTRAVENOUS

## 2014-12-08 MED ORDER — SODIUM CHLORIDE 0.9 % IV SOLN
INTRAVENOUS | Status: DC | PRN
  Administered 2014-12-08: 60 mL

## 2014-12-08 MED ORDER — ONDANSETRON HCL 4 MG/2ML IJ SOLN
INTRAMUSCULAR | Status: AC
  Filled 2014-12-08: qty 2

## 2014-12-08 MED ORDER — MORPHINE SULFATE 4 MG/ML IJ SOLN
2.0000 mg | INTRAMUSCULAR | Status: DC | PRN
  Administered 2014-12-08 (×2): 2 mg via INTRAVENOUS
  Filled 2014-12-08 (×2): qty 1

## 2014-12-08 MED ORDER — KETOROLAC TROMETHAMINE 30 MG/ML IJ SOLN
30.0000 mg | Freq: Four times a day (QID) | INTRAMUSCULAR | Status: DC
  Administered 2014-12-08 – 2014-12-09 (×4): 30 mg via INTRAVENOUS
  Filled 2014-12-08 (×3): qty 1

## 2014-12-08 MED ORDER — HYDROCHLOROTHIAZIDE 12.5 MG PO CAPS
12.5000 mg | ORAL_CAPSULE | Freq: Every day | ORAL | Status: DC
  Administered 2014-12-09: 12.5 mg via ORAL
  Filled 2014-12-08: qty 1

## 2014-12-08 MED ORDER — LACTATED RINGERS IV SOLN
INTRAVENOUS | Status: DC
  Administered 2014-12-08 (×2): via INTRAVENOUS

## 2014-12-08 MED ORDER — SCOPOLAMINE 1 MG/3DAYS TD PT72
1.0000 | MEDICATED_PATCH | Freq: Once | TRANSDERMAL | Status: DC
  Administered 2014-12-08: 1.5 mg via TRANSDERMAL

## 2014-12-08 MED ORDER — GLYCOPYRROLATE 0.2 MG/ML IJ SOLN
INTRAMUSCULAR | Status: DC | PRN
  Administered 2014-12-08: 0.6 mg via INTRAVENOUS
  Administered 2014-12-08: 0.1 mg via INTRAVENOUS

## 2014-12-08 MED ORDER — LACTATED RINGERS IR SOLN
Status: DC | PRN
  Administered 2014-12-08: 3000 mL

## 2014-12-08 MED ORDER — KETOROLAC TROMETHAMINE 30 MG/ML IJ SOLN
INTRAMUSCULAR | Status: AC
  Filled 2014-12-08: qty 1

## 2014-12-08 MED ORDER — ONDANSETRON HCL 4 MG/2ML IJ SOLN
INTRAMUSCULAR | Status: DC | PRN
  Administered 2014-12-08: 4 mg via INTRAVENOUS

## 2014-12-08 MED ORDER — DEXAMETHASONE SODIUM PHOSPHATE 10 MG/ML IJ SOLN
INTRAMUSCULAR | Status: AC
  Filled 2014-12-08: qty 1

## 2014-12-08 MED ORDER — GLYCOPYRROLATE 0.2 MG/ML IJ SOLN
INTRAMUSCULAR | Status: AC
  Filled 2014-12-08: qty 4

## 2014-12-08 MED ORDER — DEXAMETHASONE SODIUM PHOSPHATE 4 MG/ML IJ SOLN
INTRAMUSCULAR | Status: DC | PRN
  Administered 2014-12-08: 10 mg via INTRAVENOUS

## 2014-12-08 MED ORDER — PROPOFOL 10 MG/ML IV BOLUS
INTRAVENOUS | Status: AC
  Filled 2014-12-08: qty 20

## 2014-12-08 MED ORDER — ONDANSETRON HCL 4 MG/2ML IJ SOLN: 4.0000 mg | Freq: Once | INTRAMUSCULAR | Status: DC | PRN

## 2014-12-08 MED ORDER — OXYCODONE-ACETAMINOPHEN 5-325 MG PO TABS
1.0000 | ORAL_TABLET | ORAL | Status: DC | PRN
  Administered 2014-12-08 – 2014-12-09 (×2): 1 via ORAL
  Filled 2014-12-08 (×2): qty 1

## 2014-12-08 MED ORDER — MIDAZOLAM HCL 2 MG/2ML IJ SOLN
INTRAMUSCULAR | Status: AC
  Filled 2014-12-08: qty 2

## 2014-12-08 MED ORDER — MIDAZOLAM HCL 5 MG/5ML IJ SOLN
INTRAMUSCULAR | Status: DC | PRN
  Administered 2014-12-08: 2 mg via INTRAVENOUS

## 2014-12-08 MED ORDER — STERILE WATER FOR IRRIGATION IR SOLN
Status: DC | PRN
  Administered 2014-12-08: 1000 mL via INTRAVESICAL
  Administered 2014-12-08: 3000 mL

## 2014-12-08 MED ORDER — ROCURONIUM BROMIDE 100 MG/10ML IV SOLN
INTRAVENOUS | Status: AC
  Filled 2014-12-08: qty 1

## 2014-12-08 MED ORDER — NEOSTIGMINE METHYLSULFATE 10 MG/10ML IV SOLN
INTRAVENOUS | Status: DC | PRN
Start: 2014-12-08 — End: 2014-12-08
  Administered 2014-12-08: 4 mg via INTRAVENOUS

## 2014-12-08 MED ORDER — ROCURONIUM BROMIDE 100 MG/10ML IV SOLN
INTRAVENOUS | Status: DC | PRN
  Administered 2014-12-08: 40 mg via INTRAVENOUS
  Administered 2014-12-08: 20 mg via INTRAVENOUS
  Administered 2014-12-08: 10 mg via INTRAVENOUS
  Administered 2014-12-08: 20 mg via INTRAVENOUS

## 2014-12-08 MED ORDER — LABETALOL HCL 100 MG PO TABS
100.0000 mg | ORAL_TABLET | Freq: Two times a day (BID) | ORAL | Status: DC
  Administered 2014-12-08 – 2014-12-09 (×2): 100 mg via ORAL
  Filled 2014-12-08 (×2): qty 1

## 2014-12-08 MED ORDER — LIDOCAINE HCL (CARDIAC) 20 MG/ML IV SOLN
INTRAVENOUS | Status: AC
  Filled 2014-12-08: qty 5

## 2014-12-08 MED ORDER — DEXTROSE-NACL 5-0.45 % IV SOLN
INTRAVENOUS | Status: DC
  Administered 2014-12-08 – 2014-12-09 (×3): via INTRAVENOUS

## 2014-12-08 MED ORDER — SCOPOLAMINE 1 MG/3DAYS TD PT72
MEDICATED_PATCH | TRANSDERMAL | Status: AC
  Filled 2014-12-08: qty 1

## 2014-12-08 MED ORDER — KETOROLAC TROMETHAMINE 30 MG/ML IJ SOLN: 30.0000 mg | Freq: Four times a day (QID) | INTRAMUSCULAR | Status: DC

## 2014-12-08 MED ORDER — PROPOFOL INFUSION 10 MG/ML OPTIME: INTRAVENOUS | Status: DC | PRN

## 2014-12-08 MED ORDER — FENTANYL CITRATE (PF) 100 MCG/2ML IJ SOLN: 25.0000 ug | INTRAMUSCULAR | Status: DC | PRN

## 2014-12-08 SURGICAL SUPPLY — 54 items
BARRIER ADHS 3X4 INTERCEED (GAUZE/BANDAGES/DRESSINGS) ×7 IMPLANT
BENZOIN TINCTURE PRP APPL 2/3 (GAUZE/BANDAGES/DRESSINGS) ×7 IMPLANT
CATH FOLEY 3WAY  5CC 16FR (CATHETERS) ×2
CATH FOLEY 3WAY 5CC 16FR (CATHETERS) ×5 IMPLANT
CLOSURE WOUND 1/4X4 (GAUZE/BANDAGES/DRESSINGS) ×1
CLOTH BEACON ORANGE TIMEOUT ST (SAFETY) ×7 IMPLANT
CONT PATH 16OZ SNAP LID 3702 (MISCELLANEOUS) ×7 IMPLANT
COVER BACK TABLE 60X90IN (DRAPES) ×14 IMPLANT
COVER TIP SHEARS 8 DVNC (MISCELLANEOUS) ×5 IMPLANT
COVER TIP SHEARS 8MM DA VINCI (MISCELLANEOUS) ×2
DECANTER SPIKE VIAL GLASS SM (MISCELLANEOUS) ×7 IMPLANT
DRAPE WARM FLUID 44X44 (DRAPE) ×7 IMPLANT
DRSG OPSITE POSTOP 3X4 (GAUZE/BANDAGES/DRESSINGS) ×7 IMPLANT
DURAPREP 26ML APPLICATOR (WOUND CARE) ×7 IMPLANT
ELECT REM PT RETURN 9FT ADLT (ELECTROSURGICAL) ×7
ELECTRODE REM PT RTRN 9FT ADLT (ELECTROSURGICAL) ×5 IMPLANT
GAUZE VASELINE 3X9 (GAUZE/BANDAGES/DRESSINGS) IMPLANT
GLOVE BIOGEL PI IND STRL 7.0 (GLOVE) ×20 IMPLANT
GLOVE BIOGEL PI INDICATOR 7.0 (GLOVE) ×8
GLOVE ECLIPSE 6.5 STRL STRAW (GLOVE) ×28 IMPLANT
KIT ACCESSORY DA VINCI DISP (KITS) ×2
KIT ACCESSORY DVNC DISP (KITS) ×5 IMPLANT
LEGGING LITHOTOMY PAIR STRL (DRAPES) ×7 IMPLANT
LIQUID BAND (GAUZE/BANDAGES/DRESSINGS) ×7 IMPLANT
NEEDLE INSUFFLATION 120MM (ENDOMECHANICALS) ×7 IMPLANT
OCCLUDER COLPOPNEUMO (BALLOONS) ×7 IMPLANT
PACK ROBOT WH (CUSTOM PROCEDURE TRAY) ×7 IMPLANT
PACK ROBOTIC GOWN (GOWN DISPOSABLE) ×7 IMPLANT
PAD POSITIONER PINK NONSTERILE (MISCELLANEOUS) ×7 IMPLANT
PAD PREP 24X48 CUFFED NSTRL (MISCELLANEOUS) ×14 IMPLANT
SET BI-LUMEN FLTR TB AIRSEAL (TUBING) ×7 IMPLANT
SET CYSTO W/LG BORE CLAMP LF (SET/KITS/TRAYS/PACK) ×7 IMPLANT
SET IRRIG TUBING LAPAROSCOPIC (IRRIGATION / IRRIGATOR) ×7 IMPLANT
STRIP CLOSURE SKIN 1/4X4 (GAUZE/BANDAGES/DRESSINGS) ×6 IMPLANT
SUT VIC AB 0 CT1 27 (SUTURE) ×10
SUT VIC AB 0 CT1 27XBRD ANBCTR (SUTURE) ×25 IMPLANT
SUT VICRYL 0 UR6 27IN ABS (SUTURE) ×7 IMPLANT
SUT VICRYL RAPIDE 4/0 PS 2 (SUTURE) ×14 IMPLANT
SUT VLOC 180 0 9IN  GS21 (SUTURE) ×4
SUT VLOC 180 0 9IN GS21 (SUTURE) ×10 IMPLANT
SYR 50ML LL SCALE MARK (SYRINGE) ×7 IMPLANT
TIP RUMI ORANGE 6.7MMX12CM (TIP) IMPLANT
TIP UTERINE 5.1X6CM LAV DISP (MISCELLANEOUS) IMPLANT
TIP UTERINE 6.7X10CM GRN DISP (MISCELLANEOUS) ×7 IMPLANT
TIP UTERINE 6.7X6CM WHT DISP (MISCELLANEOUS) IMPLANT
TIP UTERINE 6.7X8CM BLUE DISP (MISCELLANEOUS) ×7 IMPLANT
TOWEL OR 17X24 6PK STRL BLUE (TOWEL DISPOSABLE) ×14 IMPLANT
TRAY FOLEY BAG SILVER LF 16FR (SET/KITS/TRAYS/PACK) ×7 IMPLANT
TROCAR DILATING TIP 12MM 150MM (ENDOMECHANICALS) ×7 IMPLANT
TROCAR DISP BLADELESS 8 DVNC (TROCAR) ×5 IMPLANT
TROCAR DISP BLADELESS 8MM (TROCAR) ×2
TROCAR XCEL NON-BLD 5MMX100MML (ENDOMECHANICALS) ×7 IMPLANT
WARMER LAPAROSCOPE (MISCELLANEOUS) ×7 IMPLANT
WATER STERILE IRR 1000ML POUR (IV SOLUTION) ×21 IMPLANT

## 2014-12-08 NOTE — Anesthesia Postprocedure Evaluation (Deleted)
  Anesthesia Post-op Note  Patient: Karen Hudson  Procedure(s) Performed: Procedure(s) (LRB): ROBOTIC ASSISTED TOTAL HYSTERECTOMY  (N/A) BILATERAL SALPINGECTOMY (Bilateral) CYSTOSCOPY (N/A) VULVAR SKIN TAG REMOVAL (N/A)  Patient Location: PACU  Anesthesia Type: General  Level of Consciousness: awake and alert   Airway and Oxygen Therapy: Patient Spontanous Breathing  Post-op Pain: mild  Post-op Assessment: Post-op Vital signs reviewed, Patient's Cardiovascular Status Stable, Respiratory Function Stable, Patent Airway and No signs of Nausea or vomiting  Last Vitals:  Filed Vitals:   12/08/14 0637  BP: 135/99  Temp:   Resp:     Post-op Vital Signs: stable   Complications: No apparent anesthesia complications

## 2014-12-08 NOTE — Op Note (Signed)
12/08/2014  10:30 AM  PATIENT:  Karen Hudson  44 y.o. female  PRE-OPERATIVE DIAGNOSIS:  uetrine fibroids, menorrhagia, pelvic pain/pressure  POST-OPERATIVE DIAGNOSIS:  Same  PROCEDURE:  Procedure(s): ROBOTIC ASSISTED TOTAL HYSTERECTOMY  BILATERAL SALPINGECTOMY CYSTOSCOPY VULVAR SKIN TAG REMOVAL  SURGEON:  Bradshaw Minihan SUZANNE  ASSISTANTS: Josefa Half, MD   ANESTHESIA:   general  ESTIMATED BLOOD LOSS: 25cc  BLOOD ADMINISTERED:none   FLUIDS: 500cc LR  UOP: 650 cc clear UOP  SPECIMEN:  Uterus, cervix, bilateral fallopian tubes  DISPOSITION OF SPECIMEN:  PATHOLOGY  FINDINGS: enlarged and bulky uterus with multiple fibroids present.  318grams--weighed at end of procedure.  Normal ovaries.  No pelvic adhesions.  DESCRIPTION OF OPERATION:  Patient is taken to the operating room. She is placed in the supine position. She is a running IV in place. Informed consent was present on the chart. SCDs on her lower extremities and functioning properly. General endotracheal anesthesia was administered by the anesthesia staff without difficulty.  Once adequate anesthesia was confirmed the legs are placed in the low lithotomy position in [place]. Her arms were tucked by the side and placed on arm board.  Chlor prep was then used to prep the abdomen and Betadine was used to prep the inner thighs, perineum and vagina. Once 3 minutes had past the patient was draped in a normal standard fashion. The legs were lifted to the high lithotomy position. The cervix was visualized by placing a heavy weighted speculum in the posterior aspect of the vagina and using a curved Deaver retractor to the retract anteriorly. The anterior lip of the cervix was grasped with single-tooth tenaculum. The cervix sounded to 10cm. Pratt dilators were used to dilate the cervix up to a #21. A RUMI uterine manipulator was obtained. A #10 disposable tip was placed on the RUMI manipulator as well as a small KOH ring. This was  passed through the cervix and the bulb of the disposable tip was inflated with 10 cc of normal saline. There was a good fit of the KOH ring around the cervix. The tenaculum was removed. There is also good manipulation of the uterus. The speculum and retractor were removed as well. A Foley catheter was placed to straight drain. The concentrated urine was noted. Legs were lowered to the low lithotomy position and attention was turned the abdomen.  Ropivacaine mixture (0.5% mixed one-to-one with normal saline) was used anesthetize the skin at the umbilicus.  A Veress needle was obtained.   The abdomen was elevated and the needle was passed directly into the abdomen. The peritoneum was felt as a pop as it was passed with the needle. A syringe of normal saline was attached the needle and aspiration was performed. No blood or fluid was noted. Fluid was injected without difficulty and a second aspiration was performed. No fluid or blood or saline was noted. Fluid dripped easily into the needle. Low flow CO2 gas was attached the needle and the pneumoperitoneum was achieved without difficulty. Once 3 liters of gas was in the abdomen the Veress needle was removed and a 12 millimeter port bladed trocar were passed directly to the abdomen.  This was placed just above the umbilicus after the skin was incised with a #11 blade. The laparoscope was then used to confirm intraperitoneal placement.  The upper abdomen could be surveyed without difficulty. Locations for the 1 and #2 arm ports could also be visualized. The skin was transilluminated and the skin was anesthetized with ropivacaine mixture.  57mm skin incisions were made about 10 cm lateral to the umbilicus on each side. Then 55mm nondisposable trocar ports were passed directly into the abdomen. Also on the right lower quadrant a 5 mm skin incision was made after anesthetize the skin with the ropivacaine mixture. A 5 mm non-bladed trocar and port were also passed directly  into the abdomen. All trochars were removed.  The table was placed on the floor and the patient was placed in steep Trendelenburg.  The robot was docked in a normal standard fashion to the left of the table. In the #1 arm was placed endoscopic scissors with monopolar cautery attached and then the #2 arm was placed PK [place] with bipolar cautery attached. The systems are right side of the table for the right lower quadrant incision was located.  Both ureters were seen with normal peristalsis.  Attention was turned to the right side. With uterus on stretch the right tube was excised off the ovary and mesosalpinx was dissected to free the tube. Then the right utero-ovarian pedicle was serially clamped cauterized and incised. Right round ligament was serially clamped cauterized and incised. The anterior and posterior peritoneum of the inferior leaf of the broad ligament were opened.  The bladder was taken down below the level of the KOH ring. The right uterine artery skeletonized and then just superior to the KOH ring this vessel was serially clamped, cauterized, and incised.  Attention was turned the left side and the uterus was placed on stretch to the opposite side.  Then the tube was excised off the ovary using sharp dissection and bipolar cautery.  The mesosalpinx was incised freeing the tube. Then the left uterine ovarian pedicle was serially clamped cauterized and incised. Next the left round ligament was serially clamped cauterized and incised. The anterior posterior peritoneum of the inferiorly for the broad ligament were opened. The anterior peritoneum was carried across to the dissection on the right side. The remainder of the bladder flap was created using sharp dissection. The bladder was well below the level of the KOH ring. The left uterine artery skeletonized. Then the left uterine artery, above the level of the KOH ring, was serially clamped cauterized and incised. The uterus was devascularized at  this point.  The colpotomy was performed a starting in the midline and using monopolar cautery with an open edge of the scissors. This was carried around a circumferential fashion until the vaginal mucosa was completely incised in the specimen was freed.  The specimen was then delivered to the vagina.  A vaginal occlusive device was used to maintain the pneumoperitoneum  Instruments were changed with a needle cut suture driver placed in arm 1 and a Cobra grasper placed #2. A V. lock suture was passed through the middle port. Starting in the right angle, the cuff was closed incorporating the anterior and posterior vaginal mucosa in each stitch. This was carried across all the way to the left corner and a running fashion. Two stitches were brought back towards the midline and the suture was cut flush with the vagina. The needle was brought out the pelvis. The pelvis was irrigated. All pedicles were inspected. No bleeding was noted. In Interceed was placed across vaginal cuff. Ureters were noted deep in the pelvis to be peristalsing.  At this point the procedure was completed. The instruments were removed. The robot was undocked. The patient was taken out of Trendelenburg positioning. The ports were removed under direct vision of the laparoscope and the  pneumoperitoneum was relieved. Several deep breaths were given to the patient's trying to any gas the abdomen and finally the midline port was removed.  The midline port was closed at the fascial level with figure-of-eight suture of #0 Vicryl. The skin was then closed with subcuticular stitches of 3-0 Vicryl. The skin was cleansed Dermabond was applied. Attention was then turned the vagina and the cuff was inspected. No bleeding was noted. The anterior posterior vaginal assistant incorporated in each stitch. The Foley catheter was remnoved and cystoscopy was performed.  No sutures or bladder injury were noted.  She ureteral orifices were seen shooting normal jets  of urine.  The fluid was drained from the bladder and the cystoscopy ended.  The foley was replaced.  Sponge, lap, needle, initially counts were correct x2. Patient tolerated the procedure very well. She was awakened from anesthesia, extubated and taken to recovery in stable condition.   COUNTS:  YES  PLAN OF CARE: Transfer to PACU

## 2014-12-08 NOTE — Addendum Note (Signed)
Addendum  created 12/08/14 1400 by Talbot Grumbling, CRNA   Modules edited: Notes Section   Notes Section:  File: [id number]

## 2014-12-08 NOTE — Transfer of Care (Signed)
Immediate Anesthesia Transfer of Care Note  Patient: KHAI TORBERT  Procedure(s) Performed: Procedure(s): ROBOTIC ASSISTED TOTAL HYSTERECTOMY  (N/A) BILATERAL SALPINGECTOMY (Bilateral) CYSTOSCOPY (N/A) VULVAR SKIN TAG REMOVAL (N/A)  Patient Location: PACU  Anesthesia Type:General  Level of Consciousness: awake, alert  and oriented  Airway & Oxygen Therapy: Patient Spontanous Breathing and Patient connected to nasal cannula oxygen  Post-op Assessment: Report given to RN and Post -op Vital signs reviewed and stable  Post vital signs: Reviewed and stable  Last Vitals:  Filed Vitals:   12/08/14 0637  BP: 135/99  Temp:   Resp:     Complications: No apparent anesthesia complications

## 2014-12-08 NOTE — Progress Notes (Signed)
Day of Surgery Procedure(s) (LRB): ROBOTIC ASSISTED TOTAL HYSTERECTOMY  (N/A) BILATERAL SALPINGECTOMY (Bilateral) CYSTOSCOPY (N/A) VULVAR SKIN TAG REMOVAL (N/A)  Subjective: Patient reports no nausea, good pain control.  Eating regular diet when I went into room.  Has been up in room and walked a little.    Objective: I have reviewed patient's vital signs, intake and output, medications and labs. UOP: 2000+cc BP135-163/89-114 P 75-98  General: alert and cooperative Resp: clear to auscultation bilaterally Cardio: regular rate and rhythm, S1, S2 normal, no murmur, click, rub or gallop GI: soft, non-tender; bowel sounds normal; no masses,  no organomegaly and incision: clean, dry and intact Extremities: extremities normal, atraumatic, no cyanosis or edema and and SCDs on and functioning Vaginal Bleeding: none  Assessment: s/p Procedure(s): ROBOTIC ASSISTED TOTAL HYSTERECTOMY  (N/A) BILATERAL SALPINGECTOMY (Bilateral) CYSTOSCOPY (N/A) VULVAR SKIN TAG REMOVAL (N/A): stable and progressing well  Hypertension  Plan: Advance diet Encourage ambulation Advance to PO medication Continue foley due to being post operative.  May removed later this evening after pt has ambulated in halls.  Adding Labetalol 100mg  bid     Hale Bogus [facility] 12/08/2014, 6:09 PM

## 2014-12-08 NOTE — Anesthesia Procedure Notes (Signed)
Procedure Name: Intubation Date/Time: 12/08/2014 7:33 AM Performed by: Vernice Jefferson Pre-anesthesia Checklist: Patient identified, Emergency Drugs available, Suction available, Patient being monitored and Timeout performed Patient Re-evaluated:Patient Re-evaluated prior to inductionOxygen Delivery Method: Circle system utilized Preoxygenation: Pre-oxygenation with 100% oxygen Intubation Type: IV induction Ventilation: Mask ventilation without difficulty Laryngoscope Size: Mac and 3 Grade View: Grade II Tube type: Oral Tube size: 7.0 mm Number of attempts: 1 Airway Equipment and Method: Stylet Placement Confirmation: ETT inserted through vocal cords under direct vision,  positive ETCO2 and breath sounds checked- equal and bilateral Secured at: 22 cm Tube secured with: Tape Dental Injury: Teeth and Oropharynx as per pre-operative assessment

## 2014-12-08 NOTE — Anesthesia Postprocedure Evaluation (Signed)
Anesthesia Post Note  Patient: Karen Hudson  Procedure(s) Performed: Procedure(s) (LRB): ROBOTIC ASSISTED TOTAL HYSTERECTOMY  (N/A) BILATERAL SALPINGECTOMY (Bilateral) CYSTOSCOPY (N/A) VULVAR SKIN TAG REMOVAL (N/A)  Anesthesia type: General  Patient location: Mother/Baby  Post pain: Pain level controlled  Post assessment: Post-op Vital signs reviewed  Last Vitals:  Filed Vitals:   12/08/14 1342  BP: 155/101  Pulse:   Temp:   Resp:     Post vital signs: Reviewed  Level of consciousness: awake and alert   Complications: No apparent anesthesia complications

## 2014-12-08 NOTE — Anesthesia Postprocedure Evaluation (Signed)
  Anesthesia Post-op Note  Patient: Karen Hudson  Procedure(s) Performed: Procedure(s) (LRB): ROBOTIC ASSISTED TOTAL HYSTERECTOMY  (N/A) BILATERAL SALPINGECTOMY (Bilateral) CYSTOSCOPY (N/A) VULVAR SKIN TAG REMOVAL (N/A)  Patient Location: PACU  Anesthesia Type: General  Level of Consciousness: awake and alert   Airway and Oxygen Therapy: Patient Spontanous Breathing  Post-op Pain: mild  Post-op Assessment: Post-op Vital signs reviewed, Patient's Cardiovascular Status Stable, Respiratory Function Stable, Patent Airway and No signs of Nausea or vomiting  Last Vitals:  Filed Vitals:   12/08/14 1045  BP: 155/114  Pulse: 76  Temp:   Resp: 15    Post-op Vital Signs: stable   Complications: No apparent anesthesia complications

## 2014-12-08 NOTE — H&P (Signed)
Karen Hudson is an 44 y.o. female G1P1 SWF here for robotic TLH/bilateral salpingectomy/possible BSO/cystoscopy due to enlarged uterus, fibroids, increased pelvic pressure, and menorrhagia.  Pt has been evlauted PUS showing 13 x 8 x 6cm uterus with multiple fibroids--largest 4 x 3cm.  Alternatives were discussed but she was desirous of definitive surgery.  Risks and benefits have been reviewed.  She is here for procedure today.  Of note, pt's blood pressures have been recently managed due to elevated at pre-op.  I did confirm with anesthesia for pt to continue taking HCTZ this morning as well.  Pt has lost about 40 pounds this year on weight watchers, of note.    Pertinent Gynecological History: Menses: heavy Bleeding: regular but heavy Contraception: condoms DES exposure: denies Blood transfusions: none Sexually transmitted diseases: no past history Previous GYN Procedures: none  Last mammogram: normal Date: 2011, pt was encouraged to get one at pre-op visit Last pap: normal Date: 08/29/14 OB History: G1, P1 (vaginal delivery)  Menstrual History: No LMP recorded.    Past Medical History  Diagnosis Date  . Asthma   . Allergy   . Chicken pox   . Shingles   . GERD (gastroesophageal reflux disease)   . Kidney stones   . Thyroid disease     Enlarged thyroid  . Urinary tract infection   . Migraines   . Asthma     as a child  . Hypertension     in the past, no longer on meds    Past Surgical History  Procedure Laterality Date  . Breast surgery  2000    breast reduction  . Ankle reconstruction    . Lithotripsy      Family History  Problem Relation Age of Onset  . Heart disease Father   . Squamous cell carcinoma Father   . Prostate cancer Maternal Grandfather   . Stroke Paternal Grandfather   . Hypertension Sister   . Hypertension Sister     Social History:  reports that she has quit smoking. She has never used smokeless tobacco. She reports that she drinks alcohol. She  reports that she does not use illicit drugs.  Allergies:  Allergies  Allergen Reactions  . Sulfa Antibiotics Shortness Of Breath and Swelling    Prescriptions prior to admission  Medication Sig Dispense Refill Last Dose  . hydrochlorothiazide (MICROZIDE) 12.5 MG capsule Take 1 capsule (12.5 mg total) by mouth daily. 30 capsule 0 12/08/2014 at 0430  . ibuprofen (ADVIL,MOTRIN) 800 MG tablet Take 1 tablet (800 mg total) by mouth every 8 (eight) hours as needed. 30 tablet 0 Past Month at Unknown time  . Multiple Vitamin (MULTIVITAMIN WITH MINERALS) TABS tablet Take 1 tablet by mouth daily.   Taking  . norethindrone (MICRONOR,CAMILA,ERRIN) 0.35 MG tablet Take 1 tablet (0.35 mg total) by mouth daily. 1 Package 5 Past Week at Unknown time  . nystatin (MYCOSTATIN) powder Apply topically 2 (two) times daily. Apply to affected area for up to 7 days dust lightly (Patient not taking: Reported on 11/28/2014) 15 g 1 Not Taking  . oxyCODONE-acetaminophen (PERCOCET) 5-325 MG per tablet Take 2 tablets by mouth every 4 (four) hours as needed. use only as much as needed to relieve pain 30 tablet 0     Review of Systems  All other systems reviewed and are negative.   Temperature 86 F (30 C), temperature source Oral, resp. rate 20, SpO2 98 %. Physical Exam  Constitutional: She is oriented to person, place,  and time. She appears well-developed and well-nourished.  Cardiovascular: Normal rate and regular rhythm.   Respiratory: Effort normal and breath sounds normal.  Neurological: She is alert and oriented to person, place, and time.  Skin: Skin is warm and dry.  Psychiatric: She has a normal mood and affect.    Results for orders placed or performed during the hospital encounter of 12/08/14 (from the past 24 hour(s))  Pregnancy, urine     Status: None   Collection Time: 12/08/14  6:00 AM  Result Value Ref Range   Preg Test, Ur NEGATIVE NEGATIVE    No results found.  Assessment/Plan: 44 yo G1P1  SWF with enlarged and symptomatic fibroid uterus with menorrhagia and pelvic pressure here for definitive surgery via robotic hysterectomy, bilateral salpingectomy, possible BSO, cystoscopy.  All questions answered and pt is ready to proceed.  Hale Bogus [facility] 12/08/2014, 6:27 AM

## 2014-12-09 ENCOUNTER — Encounter (HOSPITAL_COMMUNITY): Payer: Self-pay | Admitting: Obstetrics & Gynecology

## 2014-12-09 DIAGNOSIS — D259 Leiomyoma of uterus, unspecified: Secondary | ICD-10-CM | POA: Diagnosis not present

## 2014-12-09 LAB — BASIC METABOLIC PANEL
Anion gap: 8 (ref 5–15)
BUN: 9 mg/dL (ref 6–20)
CHLORIDE: 100 mmol/L — AB (ref 101–111)
CO2: 27 mmol/L (ref 22–32)
Calcium: 8.5 mg/dL — ABNORMAL LOW (ref 8.9–10.3)
Creatinine, Ser: 0.67 mg/dL (ref 0.44–1.00)
GFR calc non Af Amer: >60 mL/min (ref >60)
Glucose, Bld: 140 mg/dL — ABNORMAL HIGH (ref 65–99)
Potassium: 3.2 mmol/L — ABNORMAL LOW (ref 3.5–5.1)
Sodium: 135 mmol/L (ref 135–145)

## 2014-12-09 LAB — CBC
HEMATOCRIT: 38 % (ref 36.0–46.0)
HEMOGLOBIN: 13.3 g/dL (ref 12.0–15.0)
MCH: 29.8 pg (ref 26.0–34.0)
MCHC: 35 g/dL (ref 30.0–36.0)
MCV: 85.2 fL (ref 78.0–100.0)
Platelets: 240 10*3/uL (ref 150–400)
RBC: 4.46 MIL/uL (ref 3.87–5.11)
RDW: 12.9 % (ref 11.5–15.5)
WBC: 15.2 10*3/uL — ABNORMAL HIGH (ref 4.0–10.5)

## 2014-12-09 MED ORDER — LABETALOL HCL 100 MG PO TABS: 100.0000 mg | ORAL_TABLET | Freq: Two times a day (BID) | ORAL | Status: DC

## 2014-12-09 MED ORDER — PROPOFOL INFUSION 10 MG/ML OPTIME
INTRAVENOUS | Status: DC | PRN
  Administered 2014-12-08: 17 mL via INTRAVENOUS

## 2014-12-09 NOTE — Progress Notes (Signed)
1 Day Post-Op Procedure(s) (LRB): ROBOTIC ASSISTED TOTAL HYSTERECTOMY  (N/A) BILATERAL SALPINGECTOMY (Bilateral) CYSTOSCOPY (N/A) VULVAR SKIN TAG REMOVAL (N/A)  Subjective: Patient reports good pain control. No nausea.  Hasn't passed flatus yet.  Tolerating regular diet.  Ambulating without difficulty.  Has already voided this am.  BP improved with additional of labetalol 100mg  bid.  Objective: I have reviewed patient's vital signs, intake and output, medications and labs. Post op hb: 13.3  General: alert and cooperative Resp: clear to auscultation bilaterally Cardio: regular rate and rhythm, S1, S2 normal, no murmur, click, rub or gallop GI: soft, non-tender; bowel sounds normal; no masses,  no organomegaly and incision: clean, dry and intact Extremities: extremities normal, atraumatic, no cyanosis or edema Vaginal Bleeding: minimal  Assessment: s/p Procedure(s): ROBOTIC ASSISTED TOTAL HYSTERECTOMY  (N/A) BILATERAL SALPINGECTOMY (Bilateral) CYSTOSCOPY (N/A) VULVAR SKIN TAG REMOVAL (N/A): progressing well  Plan: Discharge home  Continue labetalol at least for next week.  Plan follow up with PCP.     Karen Hudson [facility] 12/09/2014, 7:35 AM

## 2014-12-09 NOTE — Discharge Instructions (Signed)
Post Op Hysterectomy Instructions Please read the instructions below. Refer to these instructions for the next few weeks. These instructions provide you with general information on caring for yourself after surgery. Your caregiver may also give you specific instructions. While your treatment has been planned according to the most current medical practices available, unavoidable problems sometimes happen. If you have any problems or questions after you leave, please call your caregiver.  HOME CARE INSTRUCTIONS Healing will take time. You will have discomfort, tenderness, swelling and bruising at the operative site for a couple of weeks. This is normal and will get better as time goes on.   Only take over-the-counter or prescription medicines for pain, discomfort or fever as directed by your caregiver.   Do not take aspirin. It can cause bleeding.   Do not drive when taking pain medication.   Follow your caregivers advice regarding diet, exercise, lifting, driving and general activities.   Resume your usual diet as directed and allowed.   Get plenty of rest and sleep.   Do not douche, use tampons, or have sexual intercourse until your caregiver gives you permission. .   Take your temperature if you feel hot or flushed.   You may shower today when you get home.  No tub bath for one week.    Do not drink alcohol until you are not taking any narcotic pain medications.   Try to have someone home with you for a week or two to help with the household activities.   Be careful over the next two to three weeks with any activities at home that involve lifting, pushing, or pulling.  Listen to your body--if something feels uncomfortable to do, then don't do it.  Make sure you and your family understands everything about your operation and recovery.   Walking up stairs is fine.  Do not sign any legal documents until you feel normal again.   Keep all your follow-up appointments as recommended by  your caregiver.   PLEASE CALL THE OFFICE IF:  There is swelling, redness or increasing pain in the wound area.   Pus is coming from the wound.   You notice a bad smell from the wound or surgical dressing.   You have pain, redness and swelling from the intravenous site.   The wound is breaking open (the edges are not staying together).    You develop pain or bleeding when you urinate.   You develop abnormal vaginal discharge.   You have any type of abnormal reaction or develop an allergy to your medication.   You need stronger pain medication for your pain   SEEK IMMEDIATE MEDICAL CARE:  You develop a temperature of 100.5 or higher.   You develop abdominal pain.   You develop chest pain.   You develop shortness of breath.   You pass out.   You develop pain, swelling or redness of your leg.   You develop heavy vaginal bleeding with or without blood clots.   MEDICATIONS:  Restart your regular medications BUT wait one week before restarting all vitamins and mineral supplements  Remove the scopolamine and dressing at umbilicus on Thursday.  Use Motrin 800mg  every 8 hours for the next several days.  This will help you use less Percocet.  Use the Percocet 5/325 1-2 tabs every 4-6 hours as needed for pain.  You may use an over the counter stool softener like Colace or Dulcolax to help with starting a bowel movement.  Start the day after  you go home.  Warm liquids, fluids, and ambulation help too.  If you have not had a bowel movement in four days, you need to call the office.

## 2014-12-09 NOTE — Discharge Summary (Signed)
Physician Discharge Summary  Patient ID: Karen Hudson MRN: [id number] DOB/AGE: May 20, 1971 44 y.o.  Admit date: 12/08/2014 Discharge date: 12/09/2014  Admission Diagnoses: fibroid uterus, pelvic pressure  Discharge Diagnoses:  Active Problems:   * No active hospital problems. *  Discharged Condition: good  Hospital Course: Patient admitted through same day surgery.  She was taken to OR where robotic TLH/bilateral salpingectomy/cystoscopy were performed.  Surgical findings included enlarged and fibroid uterus.  Surgery was uneventful.  EBL 25cc.  Foley catheter was left in place in OR.  Patient transferred to PACU where she was stable and then to 3rd floor for the remainder of her hospitalization.  During her post-op recovery, her vitals and stable and she was AF.  In evening of POD#0, she was able to transition to oral pain medications and regular diet.  She was able to ambulate and she had good pain control.  Patient seen both in the evening of POD#0 and AM of POD#1.  Foley was removed in AM of POD#1.  In the AM of POD#1, she was without complaint.  Post op hb was 13.3.  At this point, patient was voiding, walking, having excellent pain control, had no nausea, and minimal vaginal bleeding.  She was ready for D/C.  Consults: None  Significant Diagnostic Studies: labs: post op hb 13.3  Treatments: surgery: robotic TLH/bilateral salpingectomy/cystoscopy  Discharge Exam: Blood pressure 137/86, pulse 89, temperature 98.7 F (37.1 C), temperature source Oral, resp. rate 18, height 5\' 6"  (1.676 m), weight 184 lb (83.462 kg), SpO2 97 %. General appearance: alert and cooperative Resp: clear to auscultation bilaterally Cardio: regular rate and rhythm, S1, S2 normal, no murmur, click, rub or gallop GI: soft, non-tender; bowel sounds normal; no masses,  no organomegaly Incision/Wound:c/d/i  Disposition: Final discharge disposition not confirmed     Medication List    ASK your doctor about  these medications        hydrochlorothiazide 12.5 MG capsule  Commonly known as:  MICROZIDE  Take 1 capsule (12.5 mg total) by mouth daily.     ibuprofen 800 MG tablet  Commonly known as:  ADVIL,MOTRIN  Take 1 tablet (800 mg total) by mouth every 8 (eight) hours as needed.     multivitamin with minerals Tabs tablet  Take 1 tablet by mouth daily.     norethindrone 0.35 MG tablet  Commonly known as:  MICRONOR,CAMILA,ERRIN  Take 1 tablet (0.35 mg total) by mouth daily.     nystatin powder  Commonly known as:  MYCOSTATIN  Apply topically 2 (two) times daily. Apply to affected area for up to 7 days dust lightly     oxyCODONE-acetaminophen 5-325 MG per tablet  Commonly known as:  PERCOCET  Take 2 tablets by mouth every 4 (four) hours as needed. use only as much as needed to relieve pain           Follow-up Information    Follow up with Lyman Speller, MD On 12/16/2014.   Specialty:  Gynecology   Why:  4 pm appt   Contact information:   [address] [place] [address] [phone]       Signed: Lyman Speller 12/09/2014, 7:41 AM

## 2014-12-09 NOTE — Progress Notes (Signed)
Ambulated out  Teaching complete   

## 2014-12-09 NOTE — Addendum Note (Signed)
Addendum  created 12/09/14 1107 by Vernice Jefferson, CRNA   Modules edited: Anesthesia Medication Administration

## 2014-12-09 NOTE — Progress Notes (Signed)
Pt teaching complete    Wants to ambulate to car

## 2014-12-12 ENCOUNTER — Telehealth: Payer: Self-pay | Admitting: Family

## 2014-12-12 MED ORDER — HYDROCHLOROTHIAZIDE 12.5 MG PO CAPS: 12.5000 mg | ORAL_CAPSULE | Freq: Every day | ORAL | Status: DC

## 2014-12-12 NOTE — Telephone Encounter (Signed)
Patient recently had surgery and was prescribed labetalol (NORMODYNE) 100 MG tablet [[id number]. You have prescribed her hydrochlorothiazide (MICROZIDE) 12.5 MG capsule. She is wondering if she should go ahead and keep taking the hydrochlorothiazide. If so she will need a refill for it. Please advise patient

## 2014-12-12 NOTE — Telephone Encounter (Signed)
Informed patient on vm

## 2014-12-12 NOTE — Telephone Encounter (Signed)
Medication refill has been sent to the pharmacy and please continue to take both medications.

## 2014-12-16 ENCOUNTER — Ambulatory Visit (INDEPENDENT_AMBULATORY_CARE_PROVIDER_SITE_OTHER): Payer: Managed Care, Other (non HMO) | Admitting: Obstetrics & Gynecology

## 2014-12-16 ENCOUNTER — Encounter: Payer: Self-pay | Admitting: Obstetrics & Gynecology

## 2014-12-16 VITALS — BP 120/84 | HR 80 | Resp 18 | Ht 66.0 in | Wt 187.0 lb

## 2014-12-16 DIAGNOSIS — Z9889 Other specified postprocedural states: Secondary | ICD-10-CM

## 2014-12-16 NOTE — Progress Notes (Signed)
Post Operative Visit  Procedure:  Robotic TLH/bilateral salpingectomy/cystoscopy    Days Post-op: 8 days  Subjective: Doing well.  Took motrin, only, post operatively.  No vaginal bleeding or discharge.  Having bowel movements.  Urinary function is normal.  No fevers.  Has spotted a little bit.     Objective: BP 120/84 mmHg  Pulse 80  Resp 18  Ht 5\' 6"  (1.676 m)  Wt 187 lb (84.823 kg)  BMI 30.20 kg/m2  LMP 11/17/2014 (Exact Date)  EXAM General: alert and cooperative Resp: clear to auscultation bilaterally Cardio: regular rate and rhythm, S1, S2 normal, no murmur, click, rub or gallop GI: soft, non-tender; bowel sounds normal; no masses,  no organomegaly and incision: clean, dry and intact Extremities: extremities normal, atraumatic, no cyanosis or edema Vaginal Bleeding: none  Assessment: s/p Robotic TLH/bilateral salpingectomy/cystoscopy  Plan: Recheck 3 weeks

## 2014-12-22 ENCOUNTER — Encounter: Payer: Self-pay | Admitting: Certified Nurse Midwife

## 2015-01-08 ENCOUNTER — Encounter: Payer: Self-pay | Admitting: Obstetrics & Gynecology

## 2015-01-08 ENCOUNTER — Ambulatory Visit (INDEPENDENT_AMBULATORY_CARE_PROVIDER_SITE_OTHER): Payer: Managed Care, Other (non HMO) | Admitting: Obstetrics & Gynecology

## 2015-01-08 VITALS — BP 102/70 | HR 68 | Resp 20 | Wt 187.0 lb

## 2015-01-08 DIAGNOSIS — Z9889 Other specified postprocedural states: Secondary | ICD-10-CM

## 2015-01-08 NOTE — Progress Notes (Signed)
Post Operative Visit  Procedure:Robotic TLH/bilateral salpingectomy/cystoscopy Days Post-op: 4 weeks  Subjective: Pt reports she feels really well.  Pt reports she feels more tired at the end of the day.  No vaginal bleeding.  She has noted a little spotting with wiping only and nothing in several days.  Voiding and bowel movements are normal.    Objective: LMP 11/17/2014 (Exact Date)  BP: 102/70  EXAM General: alert and cooperative Resp: clear to auscultation bilaterally Cardio: regular rate and rhythm, S1, S2 normal, no murmur, click, rub or gallop GI: soft, non-tender; bowel sounds normal; no masses,  no organomegaly and incision: clean, dry and intact Extremities: extremities normal, atraumatic, no cyanosis or edema Vaginal Bleeding: cuff healing well, suture intact, no vaginal bleeding  Assessment: s/p TLH, bilateral salpingectomy, cystoscopy Hypertension peri-operatively  Plan: Recheck 1 year for AEX.  Appt already scheduled for 2/17 with D. Hollice Espy. Pt has follow up in one month with PCP regarding continuing anti-hypertensive medications.  No rx needed.

## 2015-09-01 ENCOUNTER — Ambulatory Visit (INDEPENDENT_AMBULATORY_CARE_PROVIDER_SITE_OTHER): Payer: Managed Care, Other (non HMO) | Admitting: Certified Nurse Midwife

## 2015-09-01 ENCOUNTER — Encounter: Payer: Self-pay | Admitting: Certified Nurse Midwife

## 2015-09-01 VITALS — BP 120/74 | HR 76 | Resp 16 | Ht 66.0 in | Wt 193.0 lb

## 2015-09-01 DIAGNOSIS — Z01419 Encounter for gynecological examination (general) (routine) without abnormal findings: Secondary | ICD-10-CM

## 2015-09-01 DIAGNOSIS — Z0000 Encounter for general adult medical examination without abnormal findings: Secondary | ICD-10-CM | POA: Diagnosis not present

## 2015-09-01 LAB — POCT URINALYSIS DIPSTICK
BILIRUBIN UA: NEGATIVE
GLUCOSE UA: NEGATIVE
Ketones, UA: NEGATIVE
LEUKOCYTES UA: NEGATIVE
NITRITE UA: NEGATIVE
Protein, UA: NEGATIVE
RBC UA: NEGATIVE
Urobilinogen, UA: NEGATIVE
pH, UA: 5

## 2015-09-01 NOTE — Progress Notes (Signed)
45 y.o. G1P1001 Single  Caucasian Fe here for annual exam. Denies vaginal dryness or vaginal bleeding since hysterectomy. Sexually active, no STD concerns or testing desired.  Sees PCP( Dr. Colon) for aex/labs. No health issues today. Daughter graduating from high school this year!   Patient's last menstrual period was 11/17/2014 (exact date).          Sexually active: Yes.    The current method of family planning is status post hysterectomy.    Exercising: Yes.    walking Smoker:  no  Health Maintenance: Pap:  08-29-14 neg HPV HR neg MMG:  2011? Colonoscopy:  none BMD:   none TDaP:  2016 Shingles: not had shot but pt had shingles Pneumonia: no Hep C and HIV: neg maybe yrs ago declines today Labs: poct urine-neg Self breast exam: done occ   reports that she has quit smoking. She has never used smokeless tobacco. She reports that she drinks alcohol. She reports that she does not use illicit drugs.  Past Medical History  Diagnosis Date  . Asthma   . Allergy   . Chicken pox   . Shingles   . GERD (gastroesophageal reflux disease)   . Kidney stones   . Thyroid disease     Enlarged thyroid  . Urinary tract infection   . Migraines   . Asthma     as a child  . Hypertension     in the past, no longer on meds    Past Surgical History  Procedure Laterality Date  . Breast surgery  2000    breast reduction  . Ankle reconstruction    . Lithotripsy    . Robotic assisted total hysterectomy N/A 12/08/2014    Procedure: ROBOTIC ASSISTED TOTAL HYSTERECTOMY ;  Surgeon: Megan Salon, MD;  Location: [facility];  Service: Gynecology;  Laterality: N/A;  . Bilateral salpingectomy Bilateral 12/08/2014    Procedure: BILATERAL SALPINGECTOMY;  Surgeon: Megan Salon, MD;  Location: [facility];  Service: Gynecology;  Laterality: Bilateral;  . Cystoscopy N/A 12/08/2014    Procedure: CYSTOSCOPY;  Surgeon: Megan Salon, MD;  Location: [facility];  Service: Gynecology;  Laterality: N/A;  . Vulvar lesion removal  N/A 12/08/2014    Procedure: VULVAR SKIN TAG REMOVAL;  Surgeon: Megan Salon, MD;  Location: [facility];  Service: Gynecology;  Laterality: N/A;    Current Outpatient Prescriptions  Medication Sig Dispense Refill  . Multiple Vitamin (MULTIVITAMIN WITH MINERALS) TABS tablet Take 1 tablet by mouth daily.     No current facility-administered medications for this visit.    Family History  Problem Relation Age of Onset  . Heart disease Father   . Squamous cell carcinoma Father   . Prostate cancer Maternal Grandfather   . Stroke Paternal Grandfather   . Hypertension Sister   . Hypertension Sister     ROS:  Pertinent items are noted in HPI.  Otherwise, a comprehensive ROS was negative.  Exam:   BP 120/74 mmHg  Pulse 76  Resp 16  Ht 5\' 6"  (1.676 m)  Wt 193 lb (87.544 kg)  BMI 31.17 kg/m2  LMP 11/17/2014 (Exact Date) Height: 5\' 6"  (167.6 cm) Ht Readings from Last 3 Encounters:  09/01/15 5\' 6"  (1.676 m)  12/16/14 5\' 6"  (1.676 m)  12/08/14 5\' 6"  (1.676 m)    General appearance: alert, cooperative and appears stated age Head: Normocephalic, without obvious abnormality, atraumatic Neck: no adenopathy, supple, symmetrical, trachea midline and thyroid normal to inspection and palpation Lungs:  clear to auscultation bilaterally Breasts: normal appearance, no masses or tenderness, No nipple retraction or dimpling, No nipple discharge or bleeding, No axillary or supraclavicular adenopathy Heart: regular rate and rhythm Abdomen: soft, non-tender; no masses,  no organomegaly Extremities: extremities normal, atraumatic, no cyanosis or edema Skin: Skin color, texture, turgor normal. No rashes or lesions Lymph nodes: Cervical, supraclavicular, and axillary nodes normal. No abnormal inguinal nodes palpated Neurologic: Grossly normal   Pelvic: External genitalia:  no lesions              Urethra:  normal appearing urethra with no masses, tenderness or lesions              Bartholin's and  Skene's: normal                 Vagina: normal appearing vagina with normal color and discharge, no lesions              Cervix: absent              Pap taken: No. Bimanual Exam:  Uterus:  uterus absent              Adnexa: normal adnexa and no mass, fullness, tenderness               Rectovaginal: Confirms               Anus:  normal sphincter tone, no lesions  Chaperone present: yes  A:  Well Woman with normal exam  RATH with bilateral salpingectomy  Mammogram overdue     P:   Reviewed health and wellness pertinent to exam  Discussed importance of mammogram screening and given information to schedule  Pap smear as above not taken   counseled on breast self exam, mammography screening, adequate intake of calcium and vitamin D, diet and exercise  return annually or prn  An After Visit Summary was printed and given to the patient.

## 2015-09-01 NOTE — Patient Instructions (Signed)

## 2015-09-03 NOTE — Progress Notes (Signed)
Encounter reviewed Machael Raine, MD   

## 2016-03-22 ENCOUNTER — Ambulatory Visit (INDEPENDENT_AMBULATORY_CARE_PROVIDER_SITE_OTHER): Payer: Managed Care, Other (non HMO) | Admitting: Family

## 2016-03-22 ENCOUNTER — Encounter: Payer: Self-pay | Admitting: Family

## 2016-03-22 VITALS — BP 138/96 | HR 82 | Temp 98.7°F | Resp 16 | Ht 66.0 in | Wt 208.0 lb

## 2016-03-22 DIAGNOSIS — E669 Obesity, unspecified: Secondary | ICD-10-CM | POA: Insufficient documentation

## 2016-03-22 DIAGNOSIS — I10 Essential (primary) hypertension: Secondary | ICD-10-CM

## 2016-03-22 MED ORDER — HYDROCHLOROTHIAZIDE 12.5 MG PO CAPS
12.5000 mg | ORAL_CAPSULE | Freq: Every day | ORAL | 0 refills | Status: DC
Start: 2016-03-22 — End: 2017-03-17

## 2016-03-22 NOTE — Assessment & Plan Note (Signed)
Blood pressure elevated today above goal 140/90 with lifestyle management. Restart hydrochlorothiazide. No symptoms of end organ damage or worse headache of life. Encouraged to monitor blood pressure at home. Decrease sodium in diet. Follow-up for nurse visit in one month to recheck pressure.

## 2016-03-22 NOTE — Patient Instructions (Addendum)
Thank you for choosing [facility].  SUMMARY AND INSTRUCTIONS:  Check out My[employer]itness[employer] to track your caloric intake and exercise.   Join Wellness at Work program.   Check your blood pressures at home.   Medication:  Your prescription(s) have been submitted to your pharmacy or been printed and provided for you. Please take as directed and contact our office if you believe you are having problem(s) with the medication(s) or have any questions.  Labs:  Please stop by the lab on the lower level of the building for your blood work. Your results will be released to [place] (or called to you) after review, usually within 72 hours after test completion. If any changes need to be made, you will be notified at that same time.  1.) The lab is open from 7:30am to 5:30 pm Monday-Friday 2.) No appointment is necessary 3.) Fasting (if needed) is 6-8 hours after food and drink; black coffee and water are okay   Imaging / Radiology:  Please stop by radiology on the basement level of the building for your x-rays. Your results will be released to [place] (or called to you) after review, usually within 72 hours after test completion. If any treatments or changes are necessary, you will be notified at that same time.  Referrals:  Referrals have been made during this visit. You should expect to hear back from our schedulers in about 7-10 days in regards to establishing an appointment with the specialists we discussed.   Follow up:  If your symptoms worsen or fail to improve, please contact our office for further instruction, or in case of emergency go directly to the emergency room at the closest medical facility.

## 2016-03-22 NOTE — Assessment & Plan Note (Signed)
Recommend weight loss of 5-10% of current body weight. Recommend increasing physical activity to 30 minutes of moderate level activity daily. Encourage nutritional intake that focuses on nutrient dense foods and is moderate, varied, and balanced and is low in saturated fats and processed/sugary foods. Continue to monitor.

## 2016-03-22 NOTE — Progress Notes (Signed)
Subjective:    Patient ID: Karen Hudson, female    DOB: [birth year], 45 y.o.   MRN: [id number]  Chief Complaint  Patient presents with  . Paper work    had high bp and if your BMI was over a certain amount insurance would charge more so she is wanting it appealed    HPI:  Karen Hudson is a 45 y.o. female who  has a past medical history of Allergy; Asthma; Asthma; Chicken pox; GERD (gastroesophageal reflux disease); Hypertension; Kidney stones; Migraines; Shingles; Thyroid disease; and Urinary tract infection. and presents today for a follow up office visit.   1.) Essential hypertension - Not currently maintained on medication. Denies symptoms of end organ damage or worst headache of life. Notes that her blood pressure has been elevated for the last couple of weeks since she had an employment physical for insurance coverage. No currently following a low sodium diet.   BP Readings from Last 3 Encounters:  03/22/16 (!) 138/96  09/01/15 120/74  01/08/15 102/70    2.) Obesity - Continues to experience elevated BMI and associated obesity with the recommendation for follow up with primary care to develop a plan to help improve her overall health and reduce her risk for cardiovascular disease. Not currently following any specific diet. Working on improving / increasing her exercise habits. Interested in attending employer sponsored weight loss programs.    Allergies  Allergen Reactions  . Sulfa Antibiotics Shortness Of Breath and Swelling      Outpatient Medications Prior to Visit  Medication Sig Dispense Refill  . Multiple Vitamin (MULTIVITAMIN WITH MINERALS) TABS tablet Take 1 tablet by mouth daily.     No facility-administered medications prior to visit.      Review of Systems  Constitutional: Negative for chills and fever.  Eyes:       Negative for changes in vision  Respiratory: Negative for cough, chest tightness and wheezing.   Cardiovascular: Negative for chest pain,  palpitations and leg swelling.  Neurological: Negative for dizziness, weakness and light-headedness.      Objective:    BP (!) 138/96 (BP Location: Left Arm, Patient Position: Sitting, Cuff Size: Large)   Pulse 82   Temp 98.7 F (37.1 C) (Oral)   Resp 16   Ht 5\' 6"  (1.676 m)   Wt 208 lb (94.3 kg)   LMP 11/17/2014 (Exact Date)   SpO2 97%   BMI 33.57 kg/m  Nursing note and vital signs reviewed.  Physical Exam  Constitutional: She is oriented to person, place, and time. She appears well-developed and well-nourished. No distress.  Cardiovascular: Normal rate, regular rhythm, normal heart sounds and intact distal pulses.   Pulmonary/Chest: Effort normal and breath sounds normal.  Neurological: She is alert and oriented to person, place, and time.  Skin: Skin is warm and dry.  Psychiatric: She has a normal mood and affect. Her behavior is normal. Judgment and thought content normal.       Assessment & Plan:   Problem List Items Addressed This Visit      Cardiovascular and Mediastinum   Essential hypertension - Primary    Blood pressure elevated today above goal 140/90 with lifestyle management. Restart hydrochlorothiazide. No symptoms of end organ damage or worse headache of life. Encouraged to monitor blood pressure at home. Decrease sodium in diet. Follow-up for nurse visit in one month to recheck pressure.      Relevant Medications   hydrochlorothiazide (MICROZIDE) 12.5 MG capsule  Other   Obesity    Recommend weight loss of 5-10% of current body weight. Recommend increasing physical activity to 30 minutes of moderate level activity daily. Encourage nutritional intake that focuses on nutrient dense foods and is moderate, varied, and balanced and is low in saturated fats and processed/sugary foods. Continue to monitor.       Other Visit Diagnoses   None.      I am having Ms. Horkey start on hydrochlorothiazide. I am also having her maintain her multivitamin with  minerals.   Meds ordered this encounter  Medications  . hydrochlorothiazide (MICROZIDE) 12.5 MG capsule    Sig: Take 1 capsule (12.5 mg total) by mouth daily.    Dispense:  90 capsule    Refill:  0    Order Specific Question:   Supervising Provider    Answer:   Pricilla Holm A [id number]     Follow-up: Return in about 3 months (around 06/21/2016), or if symptoms worsen or fail to improve, for Nurse visit for BP.  Mauricio Po, FNP

## 2016-04-21 ENCOUNTER — Ambulatory Visit (INDEPENDENT_AMBULATORY_CARE_PROVIDER_SITE_OTHER): Payer: Managed Care, Other (non HMO)

## 2016-04-21 VITALS — BP 140/92

## 2016-04-21 DIAGNOSIS — I10 Essential (primary) hypertension: Secondary | ICD-10-CM | POA: Diagnosis not present

## 2016-04-21 NOTE — Progress Notes (Signed)
Patient ID: Karen Hudson, female   DOB: 01-May-1971, 45 y.o.   MRN: [id number] Medical treatment/procedure(s) were performed by non-physician practitioner and as supervising physician I was immediately available for consultation/collaboration. I agree with above. Hoyt Koch, MD

## 2016-04-21 NOTE — Progress Notes (Signed)
Pt came in for Nurse Visit BP check per OV notes at last visit.  BP 140/92, pt report no symptoms and that she has been checking BP at home with near same readings.  Pt will continue medication as before and will follow up with Terri Piedra FNP-C as advised

## 2016-07-04 ENCOUNTER — Ambulatory Visit: Payer: Managed Care, Other (non HMO) | Admitting: Family

## 2016-09-01 ENCOUNTER — Ambulatory Visit: Payer: Managed Care, Other (non HMO) | Admitting: Certified Nurse Midwife

## 2016-09-07 ENCOUNTER — Ambulatory Visit: Payer: Managed Care, Other (non HMO) | Admitting: Certified Nurse Midwife

## 2016-10-13 NOTE — Progress Notes (Signed)
46 y.o. G1P1001 Single  Caucasian Fe here for annual exam. Denies vaginal bleeding or vaginal dryness. Sees Dr. Colon for aex/ labs and hypertension management.. Doing weight watchers for weight loss again, works well for her. Has noted redness around groin area, history of yeast, slightly tender for the past few days. No other health issues today.   Patient's last menstrual period was 11/17/2014 (exact date).          Sexually active: Yes.    The current method of family planning is status post hysterectomy.    Exercising: Yes.    walking Smoker:  no  Health Maintenance: Pap:  08-29-14 neg HPV HR neg MMG:  2011 overdue Colonoscopy:  none BMD:   none TDaP:  2016 Shingles: none Pneumonia: none Hep C and HIV: years ago per patient Labs: discuss today Self breast exam: occasionally   reports that she has quit smoking. She has never used smokeless tobacco. She reports that she drinks alcohol. She reports that she does not use drugs.  Past Medical History:  Diagnosis Date  . Allergy   . Asthma   . Asthma    as a child  . Chicken pox   . GERD (gastroesophageal reflux disease)   . Hypertension    in the past, no longer on meds  . Kidney stones   . Migraines   . Shingles   . Thyroid disease    Enlarged thyroid  . Urinary tract infection     Past Surgical History:  Procedure Laterality Date  . ANKLE RECONSTRUCTION    . BILATERAL SALPINGECTOMY Bilateral 12/08/2014   Procedure: BILATERAL SALPINGECTOMY;  Surgeon: Megan Salon, MD;  Location: [facility];  Service: Gynecology;  Laterality: Bilateral;  . BREAST SURGERY  2000   breast reduction  . CYSTOSCOPY N/A 12/08/2014   Procedure: CYSTOSCOPY;  Surgeon: Megan Salon, MD;  Location: [facility];  Service: Gynecology;  Laterality: N/A;  . LITHOTRIPSY    . ROBOTIC ASSISTED TOTAL HYSTERECTOMY N/A 12/08/2014   Procedure: ROBOTIC ASSISTED TOTAL HYSTERECTOMY ;  Surgeon: Megan Salon, MD;  Location: [facility];  Service: Gynecology;  Laterality:  N/A;  . VULVAR LESION REMOVAL N/A 12/08/2014   Procedure: VULVAR SKIN TAG REMOVAL;  Surgeon: Megan Salon, MD;  Location: [facility];  Service: Gynecology;  Laterality: N/A;    Current Outpatient Prescriptions  Medication Sig Dispense Refill  . hydrochlorothiazide (MICROZIDE) 12.5 MG capsule Take 1 capsule (12.5 mg total) by mouth daily. 90 capsule 0  . Multiple Vitamin (MULTIVITAMIN WITH MINERALS) TABS tablet Take 1 tablet by mouth daily.     No current facility-administered medications for this visit.     Family History  Problem Relation Age of Onset  . Heart disease Father   . Squamous cell carcinoma Father   . Prostate cancer Maternal Grandfather   . Stroke Paternal Grandfather   . Hypertension Sister   . Hypertension Sister     ROS:  Pertinent items are noted in HPI.  Otherwise, a comprehensive ROS was negative.  Exam:   BP 110/78 (BP Location: Right Arm, Patient Position: Sitting, Cuff Size: Normal)   Pulse 80   Resp 16   Ht 5\' 7"  (1.702 m)   Wt 213 lb (96.6 kg)   LMP 11/17/2014 (Exact Date)   BMI 33.36 kg/m  Height: 5\' 7"  (170.2 cm) Ht Readings from Last 3 Encounters:  10/18/16 5\' 7"  (1.702 m)  03/22/16 5\' 6"  (1.676 m)  09/01/15 5\' 6"  (1.676 m)  General appearance: alert, cooperative and appears stated age Head: Normocephalic, without obvious abnormality, atraumatic Neck: no adenopathy, supple, symmetrical, trachea midline and thyroid normal to inspection and palpation Lungs: clear to auscultation bilaterally Breasts: normal appearance, no masses or tenderness, No nipple retraction or dimpling, No nipple discharge or bleeding, No axillary or supraclavicular adenopathy Noted fullness on LOQ ? Mass, slightly tender. Heart: regular rate and rhythm Abdomen: soft, non-tender; no masses,  no organomegaly Extremities: extremities normal, atraumatic, no cyanosis or edema Skin: Skin color, texture, turgor normal. No rashes or lesions Lymph nodes: Cervical,  supraclavicular, and axillary nodes normal. No abnormal inguinal nodes palpated Neurologic: Grossly normal   Pelvic: External genitalia:  no lesions, redness bilateral in groin area with slight exudate noted, wet prep taken              Urethra:  normal appearing urethra with no masses, tenderness or lesions              Bartholin's and Skene's: normal                 Vagina: normal appearing vagina with normal color and discharge, no lesions              Cervix: absent              Pap taken: No. Bimanual Exam:  Uterus:  uterus absent              Adnexa: normal adnexa and no mass, fullness, tenderness               Rectovaginal: Confirms               Anus:  normal sphincter tone, no lesions  Wet Prep: Koh,Saline + yeast  Chaperone present: yes  A:  Well Woman with normal exam  S/P TAH due to fibroids, menorrhagia  Left breast fullness ? Mass previous history of bilateral reduction surgery  Yeast dermatitis   Hypertension with PCP management  P:   Reviewed health and wellness pertinent to exam  Discussed finding of breast and need for evaluation with diagnostic mammogram and [place]. Patient agreeable. Will schedule prior to leaving office.  Discussed yeast dermatitis and etiology. Questions addressed.  Rx Nystatin/triamcinolone ointment see order with instructions  Continue follow up as indicated with PCP  Pap smear as above not taken   counseled on breast self exam, mammography screening, adequate intake of calcium and vitamin D, diet and exercise  return annually or prn  An After Visit Summary was printed and given to the patient.

## 2016-10-18 ENCOUNTER — Ambulatory Visit (INDEPENDENT_AMBULATORY_CARE_PROVIDER_SITE_OTHER): Payer: Managed Care, Other (non HMO) | Admitting: Certified Nurse Midwife

## 2016-10-18 ENCOUNTER — Encounter: Payer: Self-pay | Admitting: Certified Nurse Midwife

## 2016-10-18 VITALS — BP 110/78 | HR 80 | Resp 16 | Ht 67.0 in | Wt 213.0 lb

## 2016-10-18 DIAGNOSIS — Z01419 Encounter for gynecological examination (general) (routine) without abnormal findings: Secondary | ICD-10-CM

## 2016-10-18 DIAGNOSIS — N6332 Unspecified lump in axillary tail of the left breast: Secondary | ICD-10-CM | POA: Diagnosis not present

## 2016-10-18 DIAGNOSIS — B372 Candidiasis of skin and nail: Secondary | ICD-10-CM | POA: Diagnosis not present

## 2016-10-18 MED ORDER — NYSTATIN-TRIAMCINOLONE 100000-0.1 UNIT/GM-% EX OINT: 1.0000 "application " | TOPICAL_OINTMENT | Freq: Two times a day (BID) | CUTANEOUS | 0 refills | Status: DC

## 2016-10-18 NOTE — Progress Notes (Signed)
Patient scheduled while in office. Spoke with L[facility] at [facility]. Patient scheduled for bilateral diagnostic MMG and left breast ultrasound on 10/20/16 at 8:40 am. Patient is agreeable to date and time.

## 2016-10-18 NOTE — Patient Instructions (Signed)

## 2016-10-20 ENCOUNTER — Other Ambulatory Visit: Payer: Self-pay | Admitting: Certified Nurse Midwife

## 2016-10-20 ENCOUNTER — Ambulatory Visit
Admission: RE | Admit: 2016-10-20 | Discharge: 2016-10-20 | Disposition: A | Discharge status: Home / Self Care | Payer: Managed Care, Other (non HMO) | Source: Ambulatory Visit | Attending: Certified Nurse Midwife | Admitting: Certified Nurse Midwife

## 2016-10-20 ENCOUNTER — Telehealth: Payer: Self-pay | Admitting: *Deleted

## 2016-10-20 DIAGNOSIS — N6320 Unspecified lump in the left breast, unspecified quadrant: Secondary | ICD-10-CM

## 2016-10-20 DIAGNOSIS — N6332 Unspecified lump in axillary tail of the left breast: Secondary | ICD-10-CM

## 2016-10-20 DIAGNOSIS — N6310 Unspecified lump in the right breast, unspecified quadrant: Secondary | ICD-10-CM

## 2016-10-20 NOTE — Telephone Encounter (Signed)
Left message to call Nashaun Hillmer at [phone].  

## 2016-10-20 NOTE — Telephone Encounter (Signed)
-----   Message from Regina Eck, CNM sent at 10/20/2016 12:57 PM EDT ----- Mammogram/US showed island of tissue noted in left breast. Numerous cysts are seen in right breast. No evidence of malignancy. Needs follow up ov

## 2016-10-20 NOTE — Telephone Encounter (Signed)
Karen Hudson from [facility] calling for order for right breast ultrasound. Was advised patient in for bilateral diagnostic MMG and left breast ultrasound, radiologist would also like to take a closer look at right breast. Order has been placed for Karen Hudson, CNM to sign off, verbal order given.   Routing to provider for final review. Patient is agreeable to disposition. Will close encounter.

## 2016-10-21 NOTE — Telephone Encounter (Signed)
Spoke with patient, advised of results as seen below per Melvia Heaps, CNM. Patient scheduled for 6 week breast recheck on 12/02/16 at 8am. Patient verbalizes understanding and is agreeable.  Routing to provider for final review. Patient is agreeable to disposition. Will close encounter.

## 2016-10-24 NOTE — Progress Notes (Signed)
Encounter reviewed Jill Jertson, MD   

## 2016-11-10 ENCOUNTER — Inpatient Hospital Stay: Admission: RE | Admit: 2016-11-10 | Payer: Managed Care, Other (non HMO) | Source: Ambulatory Visit

## 2016-11-10 ENCOUNTER — Other Ambulatory Visit: Payer: Self-pay | Admitting: Internal Medicine

## 2016-11-10 ENCOUNTER — Telehealth: Payer: Self-pay | Admitting: Family

## 2016-11-10 ENCOUNTER — Ambulatory Visit (INDEPENDENT_AMBULATORY_CARE_PROVIDER_SITE_OTHER): Payer: Managed Care, Other (non HMO) | Admitting: Internal Medicine

## 2016-11-10 ENCOUNTER — Encounter: Payer: Self-pay | Admitting: Internal Medicine

## 2016-11-10 ENCOUNTER — Other Ambulatory Visit: Payer: Self-pay | Admitting: Certified Nurse Midwife

## 2016-11-10 VITALS — BP 144/88 | HR 80 | Ht 67.0 in | Wt 202.0 lb

## 2016-11-10 DIAGNOSIS — I10 Essential (primary) hypertension: Secondary | ICD-10-CM | POA: Diagnosis not present

## 2016-11-10 DIAGNOSIS — N200 Calculus of kidney: Secondary | ICD-10-CM | POA: Insufficient documentation

## 2016-11-10 DIAGNOSIS — R109 Unspecified abdominal pain: Secondary | ICD-10-CM | POA: Diagnosis not present

## 2016-11-10 LAB — BASIC METABOLIC PANEL
Creatinine: 0.8 mg/dL (ref <=1.1)
Potassium: 3.7 mmol/L (ref 3.4–5.3)

## 2016-11-10 LAB — CBC AND DIFFERENTIAL
HEMATOCRIT: 44 % (ref 36–46)
Hemoglobin: 14.8 g/dL (ref 12.0–16.0)
PLATELETS: 285 10*3/uL (ref 150–399)
WBC: 6.7 10^3/mL

## 2016-11-10 MED ORDER — HYDROCODONE-ACETAMINOPHEN 5-325 MG PO TABS: 1.0000 | ORAL_TABLET | Freq: Four times a day (QID) | ORAL | 0 refills | Status: DC | PRN

## 2016-11-10 NOTE — Telephone Encounter (Signed)
Requesting updated order in epic - CT Abdomin and Pelvis without contrast.

## 2016-11-10 NOTE — Addendum Note (Signed)
Addended by: Biagio Borg on: 11/10/2016 01:27 PM   Modules accepted: Orders

## 2016-11-10 NOTE — Assessment & Plan Note (Signed)
Suspect related to renal colic as exam o/w benign, but will need ua and cx with labs

## 2016-11-10 NOTE — Progress Notes (Signed)
Pre visit review using our clinic review tool, if applicable. No additional management support is needed unless otherwise documented below in the visit note. 

## 2016-11-10 NOTE — Progress Notes (Signed)
Subjective:    Patient ID: Karen Hudson, female    DOB: 21-Dec-1970, 46 y.o.   MRN: [id number]  HPI  Here to f/u with 3 days onset sharp right flank pain with radiation to the right abd and rlq, mild to start but now severe, woke her up several times last night, intermittent and seems to wax and wane; has hx of renal stones and lithotrypsy x 2 and this seems similar, though not having any active stones for over 3 yrs.  Denies urinary symptoms such as dysuria, frequency, urgency, hematuria or vomiting, fever, chills, though has some nausea.  Denies worsening reflux, abd pain, dysphagia, n/v, bowel change or blood, other than above.  Pt denies chest pain, increased sob or doe, wheezing, orthopnea, PND, increased LE swelling, palpitations, dizziness or syncope. Past Medical History:  Diagnosis Date  . Allergy   . Asthma   . Asthma    as a child  . Chicken pox   . GERD (gastroesophageal reflux disease)   . Hypertension    in the past, no longer on meds  . Kidney stones   . Migraines   . Shingles   . Thyroid disease    Enlarged thyroid  . Urinary tract infection    Past Surgical History:  Procedure Laterality Date  . ABDOMINAL HYSTERECTOMY    . ANKLE RECONSTRUCTION    . BILATERAL SALPINGECTOMY Bilateral 12/08/2014   Procedure: BILATERAL SALPINGECTOMY;  Surgeon: Megan Salon, MD;  Location: [facility];  Service: Gynecology;  Laterality: Bilateral;  . BREAST SURGERY  2000   breast reduction  . CYSTOSCOPY N/A 12/08/2014   Procedure: CYSTOSCOPY;  Surgeon: Megan Salon, MD;  Location: [facility];  Service: Gynecology;  Laterality: N/A;  . LITHOTRIPSY    . REDUCTION MAMMAPLASTY    . ROBOTIC ASSISTED TOTAL HYSTERECTOMY N/A 12/08/2014   Procedure: ROBOTIC ASSISTED TOTAL HYSTERECTOMY ;  Surgeon: Megan Salon, MD;  Location: [facility];  Service: Gynecology;  Laterality: N/A;  . VULVAR LESION REMOVAL N/A 12/08/2014   Procedure: VULVAR SKIN TAG REMOVAL;  Surgeon: Megan Salon, MD;  Location: [facility];   Service: Gynecology;  Laterality: N/A;    reports that she has quit smoking. She has never used smokeless tobacco. She reports that she drinks alcohol. She reports that she does not use drugs. family history includes Heart disease in her father; Hypertension in her sister and sister; Prostate cancer in her maternal grandfather; Squamous cell carcinoma in her father; Stroke in her paternal grandfather. Allergies  Allergen Reactions  . Sulfa Antibiotics Shortness Of Breath and Swelling   Current Outpatient Prescriptions on File Prior to Visit  Medication Sig Dispense Refill  . hydrochlorothiazide (MICROZIDE) 12.5 MG capsule Take 1 capsule (12.5 mg total) by mouth daily. 90 capsule 0  . Multiple Vitamin (MULTIVITAMIN WITH MINERALS) TABS tablet Take 1 tablet by mouth daily.    Marland Kitchen nystatin-triamcinolone ointment (MYCOLOG) Apply 1 application topically 2 (two) times daily. X 5 days to affected area 30 g 0   No current facility-administered medications on file prior to visit.    Review of Systems  Constitutional: Negative for other unusual diaphoresis or sweats HENT: Negative for ear discharge or swelling Eyes: Negative for other worsening visual disturbances Respiratory: Negative for stridor or other swelling  Gastrointestinal: Negative for worsening distension or other blood Genitourinary: Negative for retention or other urinary change Musculoskeletal: Negative for other MSK pain or swelling Skin: Negative for color change or other new lesions Neurological:  Negative for worsening tremors and other numbness  Psychiatric/Behavioral: Negative for worsening agitation or other fatigue All other system neg per pt    Objective:   Physical Exam BP (!) 144/88   Pulse 80   Ht 5\' 7"  (1.702 m)   Wt 202 lb (91.6 kg)   LMP 11/17/2014 (Exact Date)   SpO2 97%   BMI 31.64 kg/m  VS noted,  Constitutional: Pt appears in NAD HENT: Head: NCAT.  Right Ear: External ear normal.  Left Ear: External ear  normal.  Eyes: . Pupils are equal, round, and reactive to light. Conjunctivae and EOM are normal Nose: without d/c or deformity Neck: Neck supple. Gross normal ROM Cardiovascular: Normal rate and regular rhythm.   Pulmonary/Chest: Effort normal and breath sounds without rales or wheezing.  Abd:  Soft, NT, ND, + BS, no organomegaly, no flank tender - o/w benign exam Neurological: Pt is alert. At baseline orientation, motor grossly intact Skin: Skin is warm. No rashes, other new lesions, no LE edema Psychiatric: Pt behavior is normal without agitation  No other exam findings    Assessment & Plan:

## 2016-11-10 NOTE — Patient Instructions (Signed)
Please take all new medication as prescribed - the pain medication if needed  You will be contacted regarding the referral for: CT scan - to see [facility] now  Please go to the LAB  for the tests to be done today  You will be contacted by phone if any changes need to be made immediately.  Otherwise, you will receive a letter about your results with an explanation, but please check with MyChart first.  Please remember to sign up for MyChart if you have not done so, as this will be important to you in the future with finding out test results, communicating by private email, and scheduling acute appointments online when needed.

## 2016-11-10 NOTE — Assessment & Plan Note (Signed)
Mild elevated, likely reactive, to cont current tx,  to f/u any worsening symptoms or concerns

## 2016-11-10 NOTE — Telephone Encounter (Signed)
Order has been changed by the ordering provider.

## 2016-11-10 NOTE — Assessment & Plan Note (Signed)
Doubt infectious or msk, more likely c/w renal colic, for CT abd with CM, and unfortunately pt insists on labs to be done at [facility] as this is free to her, and is not concerned about timeliness or access to results by this office.  Also for pain control with hydrocodone prn,  to f/u any worsening symptoms or concerns

## 2016-11-11 LAB — CBC WITH DIFFERENTIAL/PLATELET
Basophils Absolute: 0 10*3/uL (ref 0.0–0.2)
Basos: 0 %
EOS (ABSOLUTE): 0.1 10*3/uL (ref 0.0–0.4)
EOS: 1 %
HEMATOCRIT: 43.6 % (ref 34.0–46.6)
Hemoglobin: 14.8 g/dL (ref 11.1–15.9)
Immature Grans (Abs): 0 10*3/uL (ref 0.0–0.1)
Immature Granulocytes: 0 %
LYMPHS ABS: 2.1 10*3/uL (ref 0.7–3.1)
Lymphs: 31 %
MCH: 29.8 pg (ref 26.6–33.0)
MCHC: 33.9 g/dL (ref 31.5–35.7)
MCV: 88 fL (ref 79–97)
MONOS ABS: 0.5 10*3/uL (ref 0.1–0.9)
Monocytes: 7 %
Neutrophils Absolute: 4.1 10*3/uL (ref 1.4–7.0)
Neutrophils: 61 %
Platelets: 285 10*3/uL (ref 150–379)
RBC: 4.96 x10E6/uL (ref 3.77–5.28)
RDW: 13.6 % (ref 12.3–15.4)
WBC: 6.7 10*3/uL (ref 3.4–10.8)

## 2016-11-11 LAB — MICROSCOPIC EXAMINATION
CASTS: NONE SEEN /LPF
RBC, UA: NONE SEEN /hpf (ref 0–?)

## 2016-11-11 LAB — COMPREHENSIVE METABOLIC PANEL
A/G RATIO: 1.7 (ref 1.2–2.2)
ALT: 10 IU/L (ref 0–32)
AST: 15 IU/L (ref 0–40)
Albumin: 4.5 g/dL (ref 3.5–5.5)
Alkaline Phosphatase: 61 IU/L (ref 39–117)
BUN/Creatinine Ratio: 13 (ref 9–23)
BUN: 10 mg/dL (ref 6–24)
Bilirubin Total: 0.4 mg/dL (ref 0.0–1.2)
CALCIUM: 9.4 mg/dL (ref 8.7–10.2)
CO2: 25 mmol/L (ref 18–29)
Chloride: 101 mmol/L (ref 96–106)
Creatinine, Ser: 0.78 mg/dL (ref 0.57–1.00)
GFR calc Af Amer: 104 mL/min/{1.73_m2} (ref >59)
GFR, EST NON AFRICAN AMERICAN: 90 mL/min/{1.73_m2} (ref >59)
GLOBULIN, TOTAL: 2.6 g/dL (ref 1.5–4.5)
Glucose: 120 mg/dL — ABNORMAL HIGH (ref 65–99)
Potassium: 3.7 mmol/L (ref 3.5–5.2)
SODIUM: 143 mmol/L (ref 134–144)
Total Protein: 7.1 g/dL (ref 6.0–8.5)

## 2016-11-11 LAB — URINALYSIS, ROUTINE W REFLEX MICROSCOPIC
BILIRUBIN UA: NEGATIVE
Glucose, UA: NEGATIVE
Ketones, UA: NEGATIVE
Leukocytes, UA: NEGATIVE
Nitrite, UA: NEGATIVE
PH UA: 6.5 (ref 5.0–7.5)
Protein, UA: NEGATIVE
Specific Gravity, UA: 1.01 (ref 1.005–1.030)
Urobilinogen, Ur: 0.2 mg/dL (ref 0.2–1.0)

## 2016-11-11 LAB — VITAMIN D 25 HYDROXY (VIT D DEFICIENCY, FRACTURES): Vit D, 25-Hydroxy: 23.6 ng/mL — ABNORMAL LOW (ref 30.0–100.0)

## 2016-11-11 LAB — LIPASE: LIPASE: 34 U/L (ref 14–72)

## 2016-11-15 ENCOUNTER — Encounter: Payer: Self-pay | Admitting: Internal Medicine

## 2016-11-17 ENCOUNTER — Encounter (INDEPENDENT_AMBULATORY_CARE_PROVIDER_SITE_OTHER): Payer: Self-pay

## 2016-11-17 ENCOUNTER — Ambulatory Visit (INDEPENDENT_AMBULATORY_CARE_PROVIDER_SITE_OTHER)
Admission: RE | Admit: 2016-11-17 | Discharge: 2016-11-17 | Disposition: A | Discharge status: Home / Self Care | Payer: Managed Care, Other (non HMO) | Source: Ambulatory Visit | Attending: Internal Medicine | Admitting: Internal Medicine

## 2016-11-17 DIAGNOSIS — R109 Unspecified abdominal pain: Secondary | ICD-10-CM | POA: Diagnosis not present

## 2016-11-18 ENCOUNTER — Telehealth: Payer: Self-pay

## 2016-11-18 NOTE — Telephone Encounter (Signed)
Pt informed and expressed understanding. 

## 2016-11-18 NOTE — Telephone Encounter (Signed)
-----   Message from Biagio Borg, MD sent at 11/18/2016  4:53 PM EDT ----- Ok to call to let pt know that CT scan only shows gallstones which do not appear to be active or causing a problem; there is no kidney stone seen, and no other new significant abnormality

## 2016-11-19 ENCOUNTER — Encounter: Payer: Self-pay | Admitting: Internal Medicine

## 2016-11-23 ENCOUNTER — Encounter: Payer: Self-pay | Admitting: Family

## 2016-12-01 ENCOUNTER — Encounter: Payer: Self-pay | Admitting: Certified Nurse Midwife

## 2016-12-02 ENCOUNTER — Ambulatory Visit (INDEPENDENT_AMBULATORY_CARE_PROVIDER_SITE_OTHER): Payer: Managed Care, Other (non HMO) | Admitting: Certified Nurse Midwife

## 2016-12-02 ENCOUNTER — Encounter: Payer: Self-pay | Admitting: Certified Nurse Midwife

## 2016-12-02 VITALS — BP 120/80 | HR 68 | Resp 16 | Ht 67.0 in | Wt 199.0 lb

## 2016-12-02 DIAGNOSIS — Z1239 Encounter for other screening for malignant neoplasm of breast: Secondary | ICD-10-CM

## 2016-12-02 DIAGNOSIS — N6001 Solitary cyst of right breast: Secondary | ICD-10-CM

## 2016-12-02 DIAGNOSIS — Z1231 Encounter for screening mammogram for malignant neoplasm of breast: Secondary | ICD-10-CM | POA: Diagnosis not present

## 2016-12-02 NOTE — Progress Notes (Signed)
   Subjective:   46 y.o. SingleCaucasian female presents for follow up of diagnostic mammogram and [place] of LOQ mass noted on aex on 10/18/16. Patient had previous bilateral reduction surgery. Patient has not noted any change. Mammogram/US showed island of tissue with no suspicious abnormalities noted. Right breast showed numerous cysts in medial inferior, no evidence of malignancy. Patient has no further tenderness in left breast. No other health issues today.  Review of Systems Pertinent items are noted in HPI.   Objective:   General appearance: alert, cooperative, appears stated age and no distress Breasts: No nipple retraction or dimpling, No nipple discharge or bleeding, No axillary or supraclavicular adenopathy, LOQ fullness still noted, but agree with feels like tissue only, no masses noted Right breast near center on right tiny breast cysts 3 palpated, non tender, which agrees with mammogram and [place] finding   Assessment:   ASSESSMENT:Patient is diagnosed with right breast cyst benign finding  and island of tissue on LOQ benign finding with mammogram and [place] confirmation   Plan:   PLAN: Discussed breast finding with patient which she also felt. Discussed monthly SBE and to advise if any changes. Discussed limited caffeine use which can sometimes increase breast cyst. Yearly 3 D screening mammogram and clinical exam. Questions addressed. Rv prn aex

## 2017-01-12 ENCOUNTER — Telehealth: Payer: Self-pay

## 2017-01-12 NOTE — Telephone Encounter (Signed)
Left detailed message letting her know that when her vitamin d was done in April that she just needed to take vit d 1000iu daily & it will be rechecked at next aex. Pt does not need to have it rechecked again right now & order has been cancelled for recheck.

## 2017-03-17 ENCOUNTER — Encounter: Payer: Self-pay | Admitting: Family

## 2017-03-17 ENCOUNTER — Ambulatory Visit (INDEPENDENT_AMBULATORY_CARE_PROVIDER_SITE_OTHER): Payer: Managed Care, Other (non HMO) | Admitting: Family

## 2017-03-17 VITALS — BP 132/92 | HR 71 | Temp 98.5°F | Resp 16 | Ht 67.0 in | Wt 185.0 lb

## 2017-03-17 DIAGNOSIS — I10 Essential (primary) hypertension: Secondary | ICD-10-CM | POA: Diagnosis not present

## 2017-03-17 DIAGNOSIS — Z23 Encounter for immunization: Secondary | ICD-10-CM

## 2017-03-17 DIAGNOSIS — Z1283 Encounter for screening for malignant neoplasm of skin: Secondary | ICD-10-CM | POA: Diagnosis not present

## 2017-03-17 MED ORDER — HYDROCHLOROTHIAZIDE 12.5 MG PO CAPS: 12.5000 mg | ORAL_CAPSULE | Freq: Every day | ORAL | 1 refills | Status: DC

## 2017-03-17 NOTE — Patient Instructions (Addendum)
Thank you for choosing [facility].  SUMMARY AND INSTRUCTIONS:  Please restart taking your hydrochlorothiazide.  Continue to monitor your blood pressure at home.  Follow a low sodium diet.   A referral to dermatology has ben placed.   Medication:  Your prescription(s) have been submitted to your pharmacy or been printed and provided for you. Please take as directed and contact our office if you believe you are having problem(s) with the medication(s) or have any questions.  Follow up:  If your symptoms worsen or fail to improve, please contact our office for further instruction, or in case of emergency go directly to the emergency room at the closest medical facility.

## 2017-03-17 NOTE — Assessment & Plan Note (Signed)
Blood pressure is elevated above goal of 140/90 as patient has not taken her medication in the last 8 months. Encouraged compliance with medication regimen to reduce risk for end organ damage in the future. Denies worst headache of life. Restart hydrochlorothiazide. Continue to monitor blood pressure at home and follow a low sodium diet.

## 2017-03-17 NOTE — Progress Notes (Signed)
Subjective:    Patient ID: Karen Hudson, female    DOB: 04/15/1971, 46 y.o.   MRN: [id number]  Chief Complaint  Patient presents with  . Hypertension    had biometrics at work and it was running 149/92    HPI:  Karen Hudson is a 46 y.o. female who  has a past medical history of Allergy; Asthma; Asthma; Chicken pox; GERD (gastroesophageal reflux disease); Hypertension; Kidney stones; Migraines; Shingles; Thyroid disease; and Urinary tract infection. and presents today for a follow up office visit.  Hypertension - Currently prescribed hydrochlorothiazide and reports that she is not currently taking the medication. Recently completed a biometric screen and noted to have a reading of 149/92. Denies worst headache of life with no symptoms of end organ damage. Blood pressure at home have been around goal. She has lost 26 pounds since previous office visit.   BP Readings from Last 3 Encounters:  03/17/17 (!) 132/92  12/02/16 120/80  11/10/16 (!) 144/88     Allergies  Allergen Reactions  . Sulfa Antibiotics Shortness Of Breath and Swelling      Outpatient Medications Prior to Visit  Medication Sig Dispense Refill  . Multiple Vitamin (MULTIVITAMIN WITH MINERALS) TABS tablet Take 1 tablet by mouth daily.    Marland Kitchen nystatin-triamcinolone ointment (MYCOLOG) Apply 1 application topically 2 (two) times daily. X 5 days to affected area 30 g 0  . hydrochlorothiazide (MICROZIDE) 12.5 MG capsule Take 1 capsule (12.5 mg total) by mouth daily. 90 capsule 0   No facility-administered medications prior to visit.     Past Medical History:  Diagnosis Date  . Allergy   . Asthma   . Asthma    as a child  . Chicken pox   . GERD (gastroesophageal reflux disease)   . Hypertension    in the past, no longer on meds  . Kidney stones   . Migraines   . Shingles   . Thyroid disease    Enlarged thyroid  . Urinary tract infection       Review of Systems  Constitutional: Negative for chills and  fever.  Eyes:       Negative for changes in vision  Respiratory: Negative for cough, chest tightness and wheezing.   Cardiovascular: Negative for chest pain, palpitations and leg swelling.  Neurological: Negative for dizziness, weakness and light-headedness.      Objective:    BP (!) 132/92 (BP Location: Left Arm, Patient Position: Sitting, Cuff Size: Large)   Pulse 71   Temp 98.5 F (36.9 C) (Oral)   Resp 16   Ht 5\' 7"  (1.702 m)   Wt 185 lb (83.9 kg)   LMP 11/17/2014 (Exact Date)   SpO2 94%   BMI 28.98 kg/m  Nursing note and vital signs reviewed.  Physical Exam  Constitutional: She is oriented to person, place, and time. She appears well-developed and well-nourished. No distress.  Cardiovascular: Normal rate, regular rhythm, normal heart sounds and intact distal pulses.   Pulmonary/Chest: Effort normal and breath sounds normal.  Neurological: She is alert and oriented to person, place, and time.  Skin: Skin is warm and dry.  Psychiatric: She has a normal mood and affect. Her behavior is normal. Judgment and thought content normal.       Assessment & Plan:   Problem List Items Addressed This Visit      Cardiovascular and Mediastinum   Essential hypertension - Primary    Blood pressure is elevated above goal of  140/90 as patient has not taken her medication in the last 8 months. Encouraged compliance with medication regimen to reduce risk for end organ damage in the future. Denies worst headache of life. Restart hydrochlorothiazide. Continue to monitor blood pressure at home and follow a low sodium diet.       Relevant Medications   hydrochlorothiazide (MICROZIDE) 12.5 MG capsule    Other Visit Diagnoses    Skin exam, screening for cancer       Relevant Orders   Ambulatory referral to Dermatology   Need for influenza vaccination       Relevant Orders   Flu Vaccine QUAD 36+ mos IM (Completed)       I am having Ms. Mohamed maintain her multivitamin with minerals,  nystatin-triamcinolone ointment, and hydrochlorothiazide.   Meds ordered this encounter  Medications  . hydrochlorothiazide (MICROZIDE) 12.5 MG capsule    Sig: Take 1 capsule (12.5 mg total) by mouth daily.    Dispense:  90 capsule    Refill:  1    Order Specific Question:   Supervising Provider    Answer:   Pricilla Holm A [[id number]]     Follow-up: Return in about 3 months (around 06/16/2017), or if symptoms worsen or fail to improve.  Mauricio Po, FNP

## 2017-06-07 ENCOUNTER — Encounter: Payer: Self-pay | Admitting: Family

## 2017-09-04 ENCOUNTER — Other Ambulatory Visit: Payer: Self-pay | Admitting: Family

## 2017-09-04 DIAGNOSIS — I10 Essential (primary) hypertension: Secondary | ICD-10-CM

## 2017-10-13 ENCOUNTER — Emergency Department (HOSPITAL_COMMUNITY)
Admission: EM | Admit: 2017-10-13 | Discharge: 2017-10-13 | Discharge status: Against Medical Advice | Payer: Managed Care, Other (non HMO)

## 2017-10-19 ENCOUNTER — Ambulatory Visit: Payer: Managed Care, Other (non HMO) | Admitting: Certified Nurse Midwife

## 2017-11-14 ENCOUNTER — Ambulatory Visit: Payer: Managed Care, Other (non HMO) | Admitting: Certified Nurse Midwife

## 2017-11-14 ENCOUNTER — Encounter: Payer: Self-pay | Admitting: Certified Nurse Midwife

## 2017-11-14 ENCOUNTER — Other Ambulatory Visit: Payer: Self-pay

## 2017-11-14 VITALS — BP 122/82 | HR 70 | Resp 16 | Ht 65.75 in | Wt 188.0 lb

## 2017-11-14 DIAGNOSIS — N951 Menopausal and female climacteric states: Secondary | ICD-10-CM

## 2017-11-14 DIAGNOSIS — Z1211 Encounter for screening for malignant neoplasm of colon: Secondary | ICD-10-CM | POA: Diagnosis not present

## 2017-11-14 DIAGNOSIS — Z01419 Encounter for gynecological examination (general) (routine) without abnormal findings: Secondary | ICD-10-CM | POA: Diagnosis not present

## 2017-11-14 NOTE — Patient Instructions (Signed)

## 2017-11-14 NOTE — Progress Notes (Signed)
47 y.o. G1P1001 Single  Caucasian Fe here for annual exam. Menopausal, denies vaginal bleeding of vaginal dryness or pelvic pain. Sees PCP for hypertension management, aex and labs.  Has been continuing weight loss with 25 pound weight loss in last year!!  No partner change or STD concerns or screening needed. Continues with exercise for weight loss also. Recent death of mother in law and trip to be with family. No other health issues today,  Patient's last menstrual period was 11/17/2014 (exact date).          Sexually active: Yes.    The current method of family planning is status post hysterectomy.    Exercising: Yes.    cardio Smoker:  no  Health Maintenance: Pap:  08-29-14 neg HPV HR neg History of Abnormal Pap: no MMG:  4/18 bilateral & rt breast u/s category c density birads 2:neg Self Breast exams: yes Colonoscopy:  None  BMD:   none TDaP:  2016 Shingles: no Pneumonia: no Hep C and HIV: both done yrs ago Labs: if needed Flu Vaccine: yes   reports that she has quit smoking. She has never used smokeless tobacco. She reports that she drinks alcohol. She reports that she does not use drugs.  Past Medical History:  Diagnosis Date  . Allergy   . Asthma   . Asthma    as a child  . Chicken pox   . GERD (gastroesophageal reflux disease)   . Hypertension    in the past, no longer on meds  . Kidney stones   . Migraines   . Shingles   . Thyroid disease    Enlarged thyroid  . Urinary tract infection     Past Surgical History:  Procedure Laterality Date  . ABDOMINAL HYSTERECTOMY    . ANKLE RECONSTRUCTION    . BILATERAL SALPINGECTOMY Bilateral 12/08/2014   Procedure: BILATERAL SALPINGECTOMY;  Surgeon: Megan Salon, MD;  Location: [facility];  Service: Gynecology;  Laterality: Bilateral;  . BREAST SURGERY  2000   breast reduction  . CYSTOSCOPY N/A 12/08/2014   Procedure: CYSTOSCOPY;  Surgeon: Megan Salon, MD;  Location: [facility];  Service: Gynecology;  Laterality: N/A;  .  LITHOTRIPSY    . REDUCTION MAMMAPLASTY    . ROBOTIC ASSISTED TOTAL HYSTERECTOMY N/A 12/08/2014   Procedure: ROBOTIC ASSISTED TOTAL HYSTERECTOMY ;  Surgeon: Megan Salon, MD;  Location: [facility];  Service: Gynecology;  Laterality: N/A;  . VULVAR LESION REMOVAL N/A 12/08/2014   Procedure: VULVAR SKIN TAG REMOVAL;  Surgeon: Megan Salon, MD;  Location: [facility];  Service: Gynecology;  Laterality: N/A;    Current Outpatient Medications  Medication Sig Dispense Refill  . hydrochlorothiazide (MICROZIDE) 12.5 MG capsule Take 1 capsule (12.5 mg total) by mouth daily. 90 capsule 1  . Multiple Vitamin (MULTIVITAMIN WITH MINERALS) TABS tablet Take 1 tablet by mouth daily.    Marland Kitchen nystatin-triamcinolone ointment (MYCOLOG) Apply 1 application topically 2 (two) times daily. X 5 days to affected area 30 g 0   No current facility-administered medications for this visit.     Family History  Problem Relation Age of Onset  . Heart disease Father   . Squamous cell carcinoma Father   . Prostate cancer Maternal Grandfather   . Stroke Paternal Grandfather   . Hypertension Sister   . Hypertension Sister     ROS:  Pertinent items are noted in HPI.  Otherwise, a comprehensive ROS was negative.  Exam:   BP 122/82   Pulse  70   Resp 16   Ht 5' 5.75" (1.67 m)   Wt 188 lb (85.3 kg)   LMP 11/17/2014 (Exact Date)   BMI 30.58 kg/m  Height: 5' 5.75" (167 cm) Ht Readings from Last 3 Encounters:  11/14/17 5' 5.75" (1.67 m)  03/17/17 5\' 7"  (1.702 m)  12/02/16 5\' 7"  (1.702 m)    General appearance: alert, cooperative and appears stated age Head: Normocephalic, without obvious abnormality, atraumatic Neck: no adenopathy, supple, symmetrical, trachea midline and thyroid normal to inspection and palpation Lungs: clear to auscultation bilaterally Breasts: normal appearance, no masses or tenderness, No nipple retraction or dimpling, No nipple discharge or bleeding, No axillary or supraclavicular adenopathy, scarring  bilateral from reduction surgery only Heart: regular rate and rhythm Abdomen: soft, non-tender; no masses,  no organomegaly Extremities: extremities normal, atraumatic, no cyanosis or edema Skin: Skin color, texture, turgor normal. No rashes or lesions Lymph nodes: Cervical, supraclavicular, and axillary nodes normal. No abnormal inguinal nodes palpated Neurologic: Grossly normal   Pelvic: External genitalia:  no lesions              Urethra:  normal appearing urethra with no masses, tenderness or lesions              Bartholin's and Skene's: normal                 Vagina: normal appearing vagina with normal color and discharge, no lesions              Cervix: absent              Pap taken: No. Bimanual Exam:  Uterus:  uterus absent              Adnexa: normal adnexa and no mass, fullness, tenderness               Rectovaginal: Confirms               Anus:  normal sphincter tone, no lesions  Chaperone present: yes  A:  Well Woman with normal exam  Menopausal no HRT,S/P for fibroids ovaries retained  Hypertension with PCP management stable  Weight loss of 25 pounds intentional!  Recent social stress with death in family  Mammogram and colonoscopy due  P:   Reviewed health and wellness pertinent to exam  Menopausal s/p TAH with bilateral salpingectomy due to fibroid and bleeding  Continue follow up with PCP as indicated  Discussed importance of SBE and mammogram yearly  Discussed risks/benefits of colonoscopy, requests referral. Patient will be called with information regarding referral.  Pap smear no   counseled on breast self exam, mammography screening, feminine hygiene, adequate intake of calcium and vitamin D, diet and exercise  return annually or prn  An After Visit Summary was printed and given to the patient.

## 2017-12-22 ENCOUNTER — Telehealth: Payer: Self-pay | Admitting: Certified Nurse Midwife

## 2017-12-22 NOTE — Telephone Encounter (Signed)
Left voicemail regarding referral appointment. The information is listed below. Should the patient need to cancel or reschedule this appointment, Please advise them to call the office they've been referred to in order to reschedule.  [facility] [address] [place], [place]  Phone: [phone]  Dr. Collene Mares 01/04/18 @ 8:30 am. Please arrive 15 minutes early and bring your insurance card and photo id and list of medications.

## 2017-12-26 NOTE — Telephone Encounter (Signed)
Left voicemail regarding referral appointment. The information is listed below. Should the patient need to cancel or reschedule this appointment, Please advise them to call the office they've been referred to in order to reschedule.  [facility] [address] [place], [place]  Phone: [phone]  Dr. Collene Mares 01/04/18 @ 8:30 am. Please arrive 15 minutes early and bring your insurance card and photo id and list of medications.

## 2017-12-28 NOTE — Telephone Encounter (Signed)
Left voicemail regarding referral appointment. The information is listed below. Should the patient need to cancel or reschedule this appointment, Please advise them to call the office they've been referred to in order to reschedule.  [facility] [address] [place], [place]  Phone: [phone]  Dr. Collene Mares 01/04/18 @ 8:45 am. Please arrive 15 minutes early and bring your insurance card and photo id and list of medications.

## 2018-03-05 ENCOUNTER — Other Ambulatory Visit: Payer: Self-pay | Admitting: Family

## 2018-03-05 DIAGNOSIS — I10 Essential (primary) hypertension: Secondary | ICD-10-CM

## 2018-03-11 ENCOUNTER — Other Ambulatory Visit: Payer: Self-pay | Admitting: Family

## 2018-03-11 DIAGNOSIS — I10 Essential (primary) hypertension: Secondary | ICD-10-CM

## 2018-03-13 ENCOUNTER — Other Ambulatory Visit: Payer: Self-pay | Admitting: *Deleted

## 2018-03-13 DIAGNOSIS — I10 Essential (primary) hypertension: Secondary | ICD-10-CM

## 2018-09-25 IMAGING — CT CT RENAL STONE PROTOCOL
2 of 4 series · 16 of 46 positions shown, 18 images · non-contrast
Comparison: None.

CLINICAL DATA: Acute right flank pain.

EXAM:
CT ABDOMEN AND PELVIS WITHOUT CONTRAST
TECHNIQUE: Multidetector CT imaging of the abdomen and pelvis was performed
following the standard protocol without IV contrast.

[Series 2: stone study 5.0 i30f 1 · axial · 0.85mm/px · z∈[-438,+7]mm · 13 of 97 slices shown, 15 images]
[im 4/97  soft-tissue]
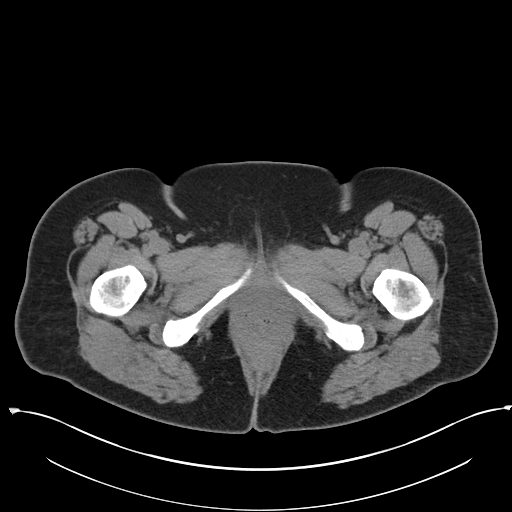
[im 4/97  bone]
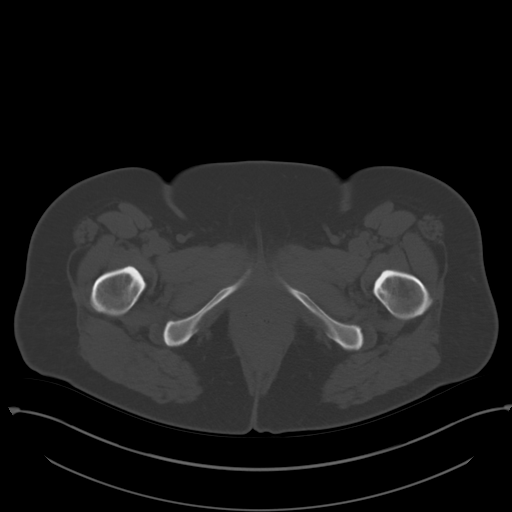
[im 12/97  soft-tissue]
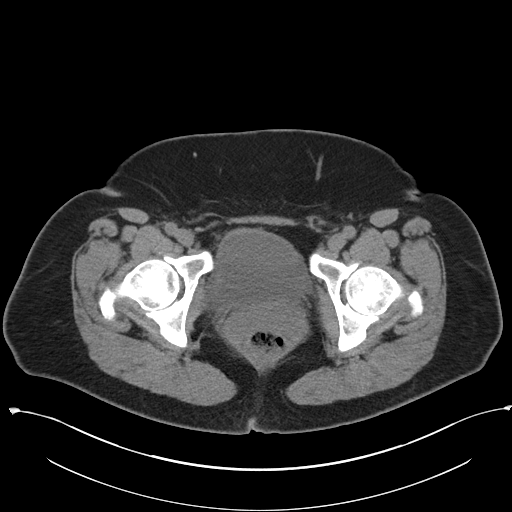
[im 19/97  soft-tissue]
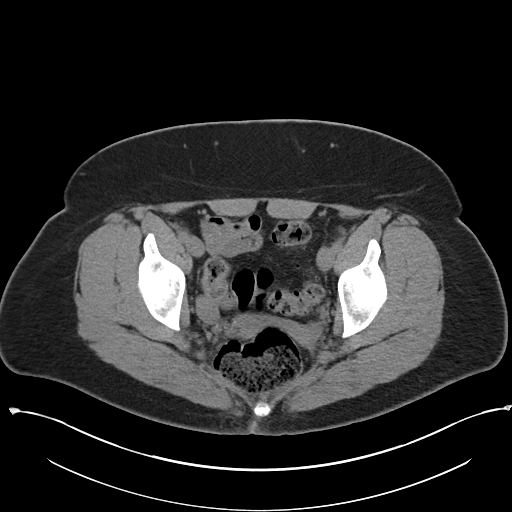
[im 26/97  soft-tissue]
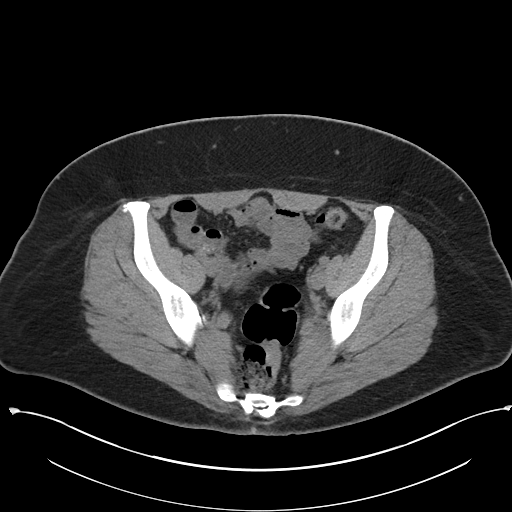
[im 34/97  soft-tissue]
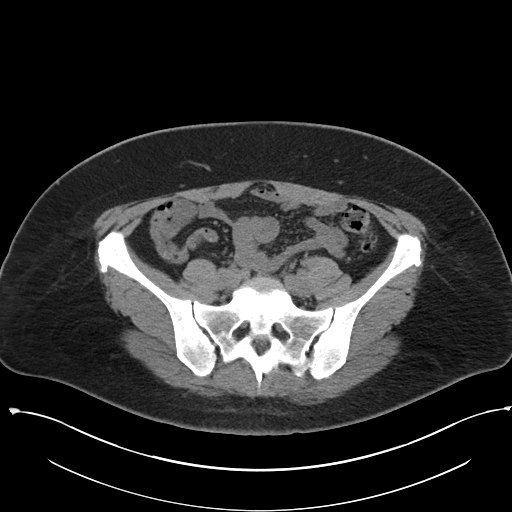
[im 41/97  soft-tissue]
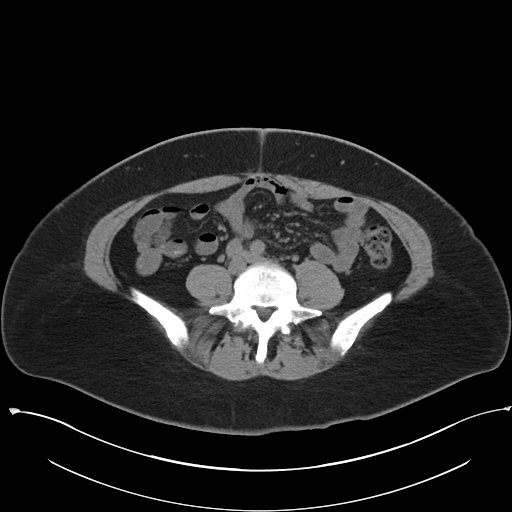
[im 49/97  soft-tissue]
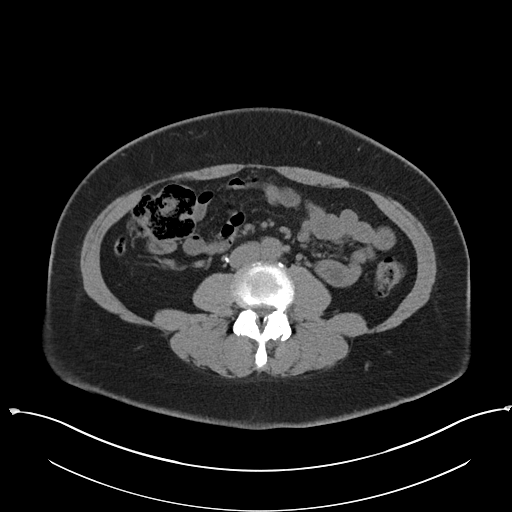
[im 56/97  soft-tissue]
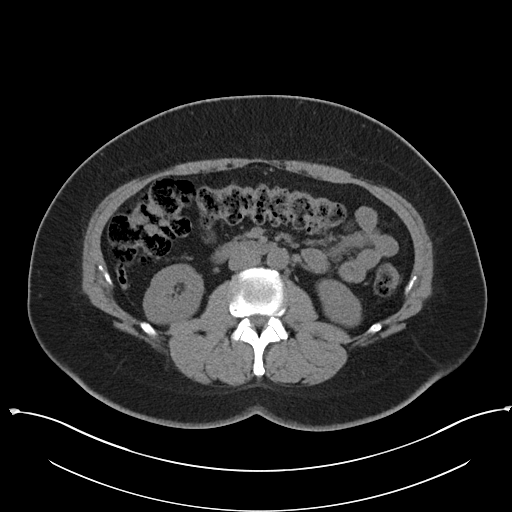
[im 63/97  soft-tissue]
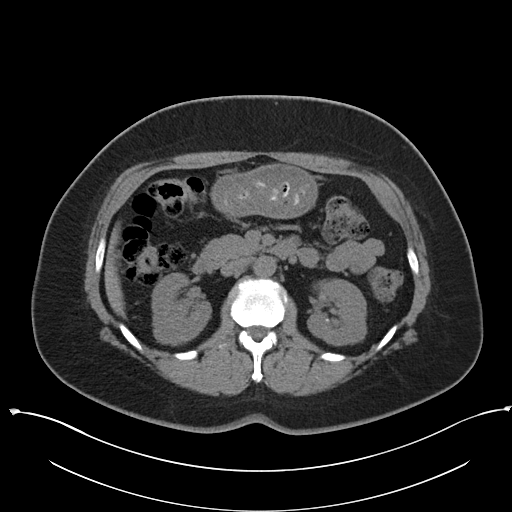
[im 63/97  bone]
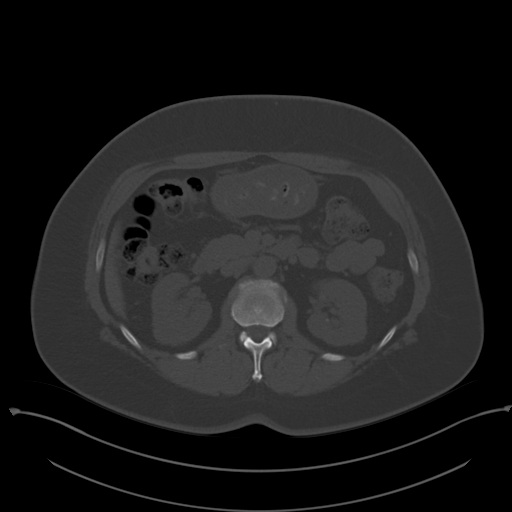
[im 71/97  soft-tissue]
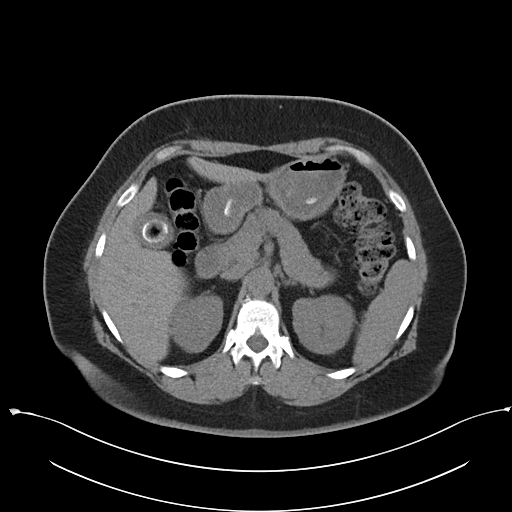
[im 78/97  soft-tissue]
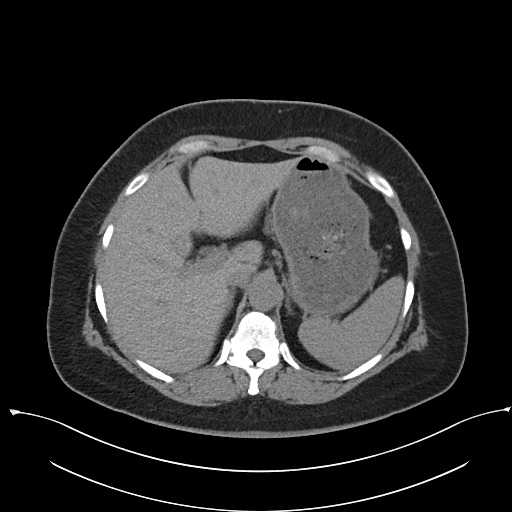
[im 85/97  soft-tissue]
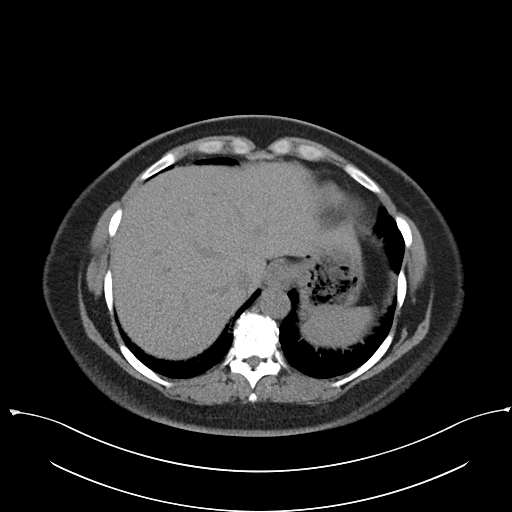
[im 93/97  soft-tissue]
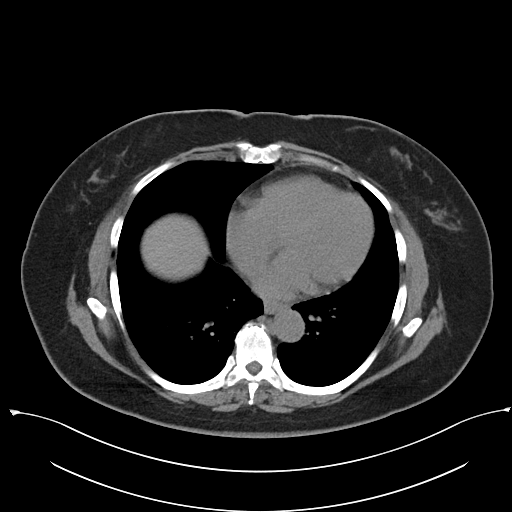

[Series 5: coronal soft tissue · coronal · 0.79mm/px · 3 of 84 slices shown]
[im 28/84  soft-tissue]
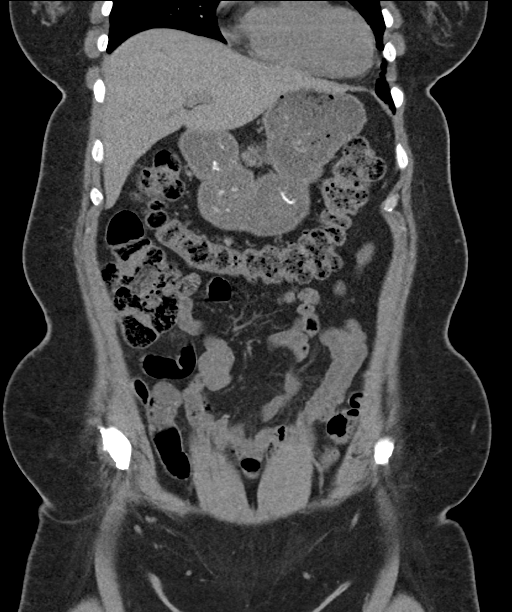
[im 37/84  soft-tissue]
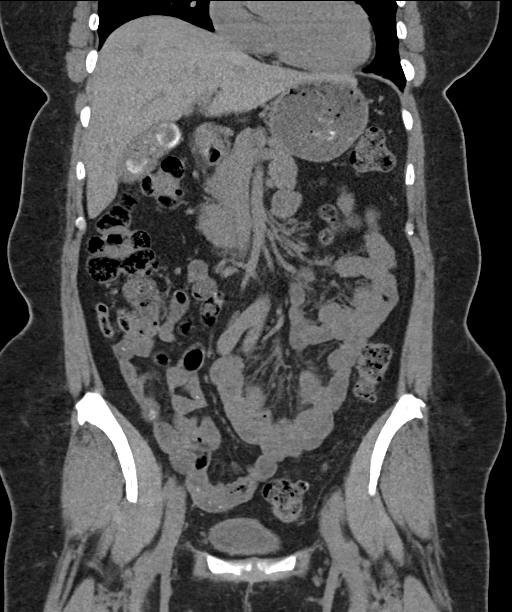
[im 47/84  soft-tissue]
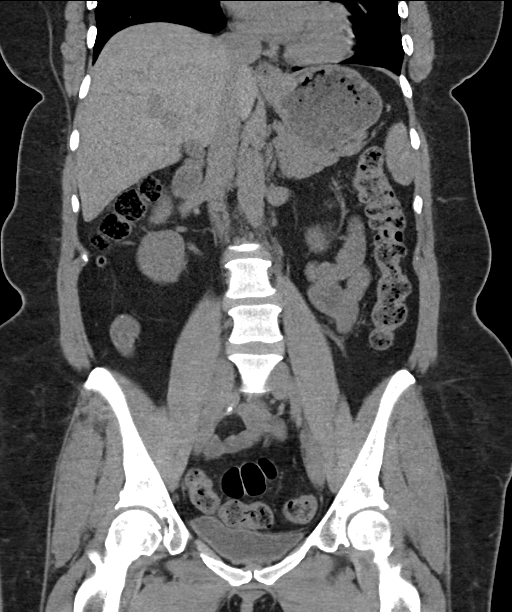

[16 of 46 positions shown; findings below may reference images not displayed]

FINDINGS: Lower chest: No acute abnormality.

Hepatobiliary: Multiple large gallstones are noted. No abnormality
is seen in the liver on these unenhanced images.

Pancreas: Unremarkable. No pancreatic ductal dilatation or
surrounding inflammatory changes.

Spleen: Normal in size without focal abnormality.

Adrenals/Urinary Tract: 2.2 cm right adrenal nodule is noted with
Hounsfield measurement of less than 0 most consistent with benign
adenoma. Left adrenal gland appears normal. Nonobstructive bilateral
nephrolithiasis is noted. No hydronephrosis or renal obstruction is
noted. No definite ureteral calculi are noted. Urinary bladder is
unremarkable.

Stomach/Bowel: Stomach is within normal limits. Appendix appears
normal. No evidence of bowel wall thickening, distention, or
inflammatory changes.

Vascular/Lymphatic: Aortic atherosclerosis. No enlarged abdominal or
pelvic lymph nodes.

Reproductive: Status post hysterectomy. No adnexal masses.

Other: Small fat containing periumbilical hernia is noted. No
abnormal fluid collection is noted.

Musculoskeletal: No acute or significant osseous findings.
IMPRESSION: Cholelithiasis without definite evidence of inflammation.

Bilateral nephrolithiasis is noted, left greater than right, but no
hydronephrosis or renal obstruction is noted.

2.2 cm probable right adrenal adenoma.

Aortic atherosclerosis.

Small fat containing periumbilical hernia.

## 2018-11-28 ENCOUNTER — Ambulatory Visit: Payer: Managed Care, Other (non HMO) | Admitting: Certified Nurse Midwife

## 2019-06-03 ENCOUNTER — Ambulatory Visit: Payer: Managed Care, Other (non HMO) | Admitting: Certified Nurse Midwife

## 2019-06-03 ENCOUNTER — Other Ambulatory Visit: Payer: Self-pay

## 2019-06-05 ENCOUNTER — Other Ambulatory Visit: Payer: Self-pay

## 2019-06-05 ENCOUNTER — Ambulatory Visit: Payer: Managed Care, Other (non HMO) | Admitting: Certified Nurse Midwife

## 2019-06-05 ENCOUNTER — Encounter: Payer: Self-pay | Admitting: Certified Nurse Midwife

## 2019-06-05 VITALS — BP 122/80 | HR 68 | Temp 97.1°F | Resp 16 | Ht 65.75 in | Wt 212.0 lb

## 2019-06-05 DIAGNOSIS — Z0000 Encounter for general adult medical examination without abnormal findings: Secondary | ICD-10-CM | POA: Diagnosis not present

## 2019-06-05 DIAGNOSIS — B372 Candidiasis of skin and nail: Secondary | ICD-10-CM

## 2019-06-05 DIAGNOSIS — R635 Abnormal weight gain: Secondary | ICD-10-CM

## 2019-06-05 DIAGNOSIS — E559 Vitamin D deficiency, unspecified: Secondary | ICD-10-CM

## 2019-06-05 DIAGNOSIS — Z01419 Encounter for gynecological examination (general) (routine) without abnormal findings: Secondary | ICD-10-CM

## 2019-06-05 MED ORDER — NYSTATIN-TRIAMCINOLONE 100000-0.1 UNIT/GM-% EX OINT: 1.0000 "application " | TOPICAL_OINTMENT | Freq: Two times a day (BID) | CUTANEOUS | 0 refills | Status: AC

## 2019-06-05 NOTE — Patient Instructions (Signed)

## 2019-06-05 NOTE — Progress Notes (Signed)
48 y.o. G1P1001 Single  Caucasian Fe here for annual exam.  Menopausal no HRT. Aware she has gained weight 32 pounds and back on weight watchers for weight loss.  Continues to use Nystatin powder for irritation. No health issues today. Has not had mammogram since 2018! Sees PCP prn. Screening labs today. No other health issues today.  Patient's last menstrual period was 11/17/2014 (exact date).          Sexually active: Yes.    The current method of family planning is status post hysterectomy.    Exercising: Yes.    walking Smoker:  no  Review of Systems  Constitutional: Negative.   HENT: Negative.   Eyes: Negative.   Respiratory: Negative.   Cardiovascular: Negative.   Gastrointestinal: Negative.   Genitourinary: Negative.   Musculoskeletal: Negative.   Skin: Negative.   Neurological: Negative.   Endo/Heme/Allergies: Negative.   Psychiatric/Behavioral: Positive for depression.    Health Maintenance: Pap:  08-29-2014 neg HPV HR neg History of Abnormal Pap: no MMG:  4/18 bilateral & rt breast u/s category c density birads 2:neg Self Breast exams: occ Colonoscopy:  none BMD:   none TDaP:  2016 Shingles: no Pneumonia: no Hep C and HIV: both done yrs ago Labs: yes screening   reports that she has quit smoking. She has never used smokeless tobacco. She reports current alcohol use. She reports that she does not use drugs.  Past Medical History:  Diagnosis Date  . Allergy   . Asthma   . Asthma    as a child  . Chicken pox   . GERD (gastroesophageal reflux disease)   . Hypertension    in the past, no longer on meds  . Kidney stones   . Migraines   . Shingles   . Thyroid disease    Enlarged thyroid  . Urinary tract infection     Past Surgical History:  Procedure Laterality Date  . ABDOMINAL HYSTERECTOMY    . ANKLE RECONSTRUCTION    . BILATERAL SALPINGECTOMY Bilateral 12/08/2014   Procedure: BILATERAL SALPINGECTOMY;  Surgeon: Megan Salon, MD;  Location: [facility];   Service: Gynecology;  Laterality: Bilateral;  . BREAST SURGERY  2000   breast reduction  . CYSTOSCOPY N/A 12/08/2014   Procedure: CYSTOSCOPY;  Surgeon: Megan Salon, MD;  Location: [facility];  Service: Gynecology;  Laterality: N/A;  . LITHOTRIPSY    . REDUCTION MAMMAPLASTY    . ROBOTIC ASSISTED TOTAL HYSTERECTOMY N/A 12/08/2014   Procedure: ROBOTIC ASSISTED TOTAL HYSTERECTOMY ;  Surgeon: Megan Salon, MD;  Location: [facility];  Service: Gynecology;  Laterality: N/A;  . VULVAR LESION REMOVAL N/A 12/08/2014   Procedure: VULVAR SKIN TAG REMOVAL;  Surgeon: Megan Salon, MD;  Location: [facility];  Service: Gynecology;  Laterality: N/A;    Current Outpatient Medications  Medication Sig Dispense Refill  . hydrochlorothiazide (MICROZIDE) 12.5 MG capsule Take 1 capsule (12.5 mg total) by mouth daily. 90 capsule 1  . Multiple Vitamin (MULTIVITAMIN WITH MINERALS) TABS tablet Take 1 tablet by mouth daily.    Marland Kitchen nystatin-triamcinolone ointment (MYCOLOG) Apply 1 application topically 2 (two) times daily. X 5 days to affected area 30 g 0   No current facility-administered medications for this visit.     Family History  Problem Relation Age of Onset  . Heart disease Father   . Squamous cell carcinoma Father   . Prostate cancer Maternal Grandfather   . Stroke Paternal Grandfather   . Hypertension Sister   .  Hypertension Sister     ROS:  Pertinent items are noted in HPI.  Otherwise, a comprehensive ROS was negative.  Exam:   LMP 11/17/2014 (Exact Date)    Ht Readings from Last 3 Encounters:  11/14/17 5' 5.75" (1.67 m)  03/17/17 5\' 7"  (1.702 m)  12/02/16 5\' 7"  (1.702 m)    General appearance: alert, cooperative and appears stated age Head: Normocephalic, without obvious abnormality, atraumatic Neck: no adenopathy, supple, symmetrical, trachea midline and thyroid normal to inspection and palpation Lungs: clear to auscultation bilaterally Breasts: normal appearance, no masses or tenderness, No nipple  retraction or dimpling, No nipple discharge or bleeding, No axillary or supraclavicular adenopathy Heart: regular rate and rhythm Abdomen: soft, non-tender; no masses,  no organomegaly Extremities: extremities normal, atraumatic, no cyanosis or edema Skin: Skin color, texture, turgor normal. No rashes or lesions Lymph nodes: Cervical, supraclavicular, and axillary nodes normal. No abnormal inguinal nodes palpated Neurologic: Grossly normal   Pelvic: External genitalia:  no lesions              Urethra:  normal appearing urethra with no masses, tenderness or lesions              Bartholin's and Skene's: normal                 Vagina: normal appearing vagina with normal color and discharge, no lesions              Cervix: absent              Pap taken: No. Bimanual Exam:  Uterus:  uterus absent              Adnexa: normal adnexa and no mass, fullness, tenderness               Rectovaginal: Confirms               Anus:  normal sphincter tone, no lesions  Chaperone present: yes  A:  Well Woman with normal exam  Menopausal no HRT S/P hysterectomy with bilateral salpingectomy  Weight gain  History of yeast dermatitis  Mammogram due  Screening labs  P:   Reviewed health and wellness pertinent to exam.  Discussed importance of weight control and weight watchers is good choice  Patient aware if vaginal bleeding to advise  Rx Nystatin ointment see order with instructions for prn use with flares  Labs: CBC,CMP, Lipid panel , TSH, Vitamin D, Hgb A1-C  Given information to schedule mammogram.  Pap smear: no   counseled on breast self exam, mammography screening, feminine hygiene, menopause, adequate intake of calcium and vitamin D, diet and exercise, Kegel's exercises  return annually or prn  An After Visit Summary was printed and given to the patient.

## 2019-06-06 ENCOUNTER — Other Ambulatory Visit: Payer: Self-pay | Admitting: Certified Nurse Midwife

## 2019-06-06 DIAGNOSIS — E559 Vitamin D deficiency, unspecified: Secondary | ICD-10-CM

## 2019-06-06 LAB — COMPREHENSIVE METABOLIC PANEL
ALT: 8 IU/L (ref 0–32)
AST: 16 IU/L (ref 0–40)
Albumin/Globulin Ratio: 1.6 (ref 1.2–2.2)
Albumin: 4.2 g/dL (ref 3.8–4.8)
Alkaline Phosphatase: 66 IU/L (ref 39–117)
BUN/Creatinine Ratio: 12 (ref 9–23)
BUN: 10 mg/dL (ref 6–24)
Bilirubin Total: 0.5 mg/dL (ref 0.0–1.2)
CO2: 27 mmol/L (ref 20–29)
Calcium: 9.2 mg/dL (ref 8.7–10.2)
Chloride: 100 mmol/L (ref 96–106)
Creatinine, Ser: 0.83 mg/dL (ref 0.57–1.00)
GFR calc Af Amer: 95 mL/min/{1.73_m2} (ref >59)
GFR calc non Af Amer: 82 mL/min/{1.73_m2} (ref >59)
Globulin, Total: 2.6 g/dL (ref 1.5–4.5)
Glucose: 90 mg/dL (ref 65–99)
Potassium: 4 mmol/L (ref 3.5–5.2)
Sodium: 140 mmol/L (ref 134–144)
Total Protein: 6.8 g/dL (ref 6.0–8.5)

## 2019-06-06 LAB — CBC
Hematocrit: 43.2 % (ref 34.0–46.6)
Hemoglobin: 14.7 g/dL (ref 11.1–15.9)
MCH: 29.8 pg (ref 26.6–33.0)
MCHC: 34 g/dL (ref 31.5–35.7)
MCV: 87 fL (ref 79–97)
Platelets: 294 10*3/uL (ref 150–450)
RBC: 4.94 x10E6/uL (ref 3.77–5.28)
RDW: 12.8 % (ref 11.7–15.4)
WBC: 6.6 10*3/uL (ref 3.4–10.8)

## 2019-06-06 LAB — LIPID PANEL
Chol/HDL Ratio: 4 ratio (ref 0.0–4.4)
Cholesterol, Total: 204 mg/dL — ABNORMAL HIGH (ref 100–199)
HDL: 51 mg/dL (ref >39)
LDL Chol Calc (NIH): 138 mg/dL — ABNORMAL HIGH (ref 0–99)
Triglycerides: 84 mg/dL (ref 0–149)
VLDL Cholesterol Cal: 15 mg/dL (ref 5–40)

## 2019-06-06 LAB — TSH: TSH: 1.12 u[IU]/mL (ref 0.450–4.500)

## 2019-06-06 LAB — VITAMIN D 25 HYDROXY (VIT D DEFICIENCY, FRACTURES): Vit D, 25-Hydroxy: 18.7 ng/mL — ABNORMAL LOW (ref 30.0–100.0)

## 2019-06-06 LAB — HEMOGLOBIN A1C
Est. average glucose Bld gHb Est-mCnc: 111 mg/dL
Hgb A1c MFr Bld: 5.5 % (ref 4.8–5.6)

## 2019-06-07 ENCOUNTER — Telehealth: Payer: Self-pay

## 2019-06-07 NOTE — Telephone Encounter (Signed)
-----   Message from Regina Eck, CNM sent at 06/06/2019  7:34 PM EST ----- Notify patient her TSH is normal Lipid panel shows borderline elevation of cholesterol at 204 normal is <199, triglycerides 84, HDL is 51, LDL elevated at 138 normal is 99, Needs to work on increase in fiber, low cholesterol intake, increase lean meat and fish. Regular exercise. Liver, kidney and glucose profile is normal Vitamin D is very low at 18.7, She needs Rx 50,000  Vitamin D once every 7 days for 3 months and then recheck order in

## 2019-06-07 NOTE — Telephone Encounter (Signed)
Left message for call back.

## 2019-06-10 MED ORDER — VITAMIN D (ERGOCALCIFEROL) 1.25 MG (50000 UNIT) PO CAPS: 50000.0000 [IU] | ORAL_CAPSULE | ORAL | 0 refills | Status: DC

## 2019-06-10 NOTE — Telephone Encounter (Signed)
Patient notified of results. Lab appt scheduled & vitamin d rx sent to pharmacy.

## 2019-09-11 ENCOUNTER — Other Ambulatory Visit: Payer: Self-pay

## 2019-09-11 ENCOUNTER — Other Ambulatory Visit (INDEPENDENT_AMBULATORY_CARE_PROVIDER_SITE_OTHER): Payer: Managed Care, Other (non HMO)

## 2019-09-11 DIAGNOSIS — E559 Vitamin D deficiency, unspecified: Secondary | ICD-10-CM

## 2019-09-12 ENCOUNTER — Other Ambulatory Visit: Payer: Self-pay | Admitting: Certified Nurse Midwife

## 2019-09-12 DIAGNOSIS — E559 Vitamin D deficiency, unspecified: Secondary | ICD-10-CM

## 2019-09-12 LAB — VITAMIN D 25 HYDROXY (VIT D DEFICIENCY, FRACTURES): Vit D, 25-Hydroxy: 17.5 ng/mL — ABNORMAL LOW (ref 30.0–100.0)

## 2019-09-12 MED ORDER — VITAMIN D (ERGOCALCIFEROL) 1.25 MG (50000 UNIT) PO CAPS: 50000.0000 [IU] | ORAL_CAPSULE | ORAL | 0 refills | Status: AC

## 2019-09-13 ENCOUNTER — Other Ambulatory Visit: Payer: Self-pay

## 2019-09-13 MED ORDER — VITAMIN D (ERGOCALCIFEROL) 1.25 MG (50000 UNIT) PO CAPS: 50000.0000 [IU] | ORAL_CAPSULE | ORAL | 0 refills | Status: AC

## 2019-10-09 ENCOUNTER — Encounter: Payer: Self-pay | Admitting: Certified Nurse Midwife

## 2019-12-05 ENCOUNTER — Other Ambulatory Visit: Payer: Self-pay

## 2019-12-06 ENCOUNTER — Other Ambulatory Visit (INDEPENDENT_AMBULATORY_CARE_PROVIDER_SITE_OTHER): Payer: Managed Care, Other (non HMO)

## 2019-12-06 DIAGNOSIS — E559 Vitamin D deficiency, unspecified: Secondary | ICD-10-CM

## 2019-12-06 DIAGNOSIS — Z0000 Encounter for general adult medical examination without abnormal findings: Secondary | ICD-10-CM

## 2019-12-07 LAB — VITAMIN D 25 HYDROXY (VIT D DEFICIENCY, FRACTURES): Vit D, 25-Hydroxy: 31.4 ng/mL (ref 30.0–100.0)

## 2020-06-08 ENCOUNTER — Ambulatory Visit: Payer: Managed Care, Other (non HMO) | Admitting: Certified Nurse Midwife
